# Patient Record
Sex: Female | Born: 1970 | Race: White | Hispanic: No | Marital: Married | Smoking: Never smoker
Health system: Southern US, Community
[De-identification: ages and names within clinical notes are randomized; demographics above are authoritative.]

## PROBLEM LIST (undated history)

## (undated) DIAGNOSIS — G4730 Sleep apnea, unspecified: Secondary | ICD-10-CM

## (undated) DIAGNOSIS — I639 Cerebral infarction, unspecified: Secondary | ICD-10-CM

## (undated) DIAGNOSIS — F329 Major depressive disorder, single episode, unspecified: Secondary | ICD-10-CM

## (undated) DIAGNOSIS — F32A Depression, unspecified: Secondary | ICD-10-CM

## (undated) DIAGNOSIS — E785 Hyperlipidemia, unspecified: Secondary | ICD-10-CM

## (undated) DIAGNOSIS — Z8489 Family history of other specified conditions: Secondary | ICD-10-CM

## (undated) DIAGNOSIS — K219 Gastro-esophageal reflux disease without esophagitis: Secondary | ICD-10-CM

## (undated) DIAGNOSIS — R7303 Prediabetes: Secondary | ICD-10-CM

## (undated) HISTORY — PX: JOINT REPLACEMENT: SHX530

## (undated) HISTORY — DX: Cerebral infarction, unspecified: I63.9

---

## 2015-05-07 HISTORY — PX: KNEE ARTHROSCOPY: SUR90

## 2015-10-07 DIAGNOSIS — I639 Cerebral infarction, unspecified: Secondary | ICD-10-CM

## 2015-10-07 HISTORY — DX: Cerebral infarction, unspecified: I63.9

## 2016-02-04 HISTORY — PX: PARTIAL KNEE ARTHROPLASTY: SHX2174

## 2017-01-19 DIAGNOSIS — N289 Disorder of kidney and ureter, unspecified: Secondary | ICD-10-CM | POA: Insufficient documentation

## 2017-02-18 ENCOUNTER — Other Ambulatory Visit: Payer: Self-pay | Admitting: Orthopedic Surgery

## 2017-02-18 DIAGNOSIS — Z96651 Presence of right artificial knee joint: Secondary | ICD-10-CM

## 2017-02-27 ENCOUNTER — Encounter
Admission: RE | Admit: 2017-02-27 | Discharge: 2017-02-27 | Disposition: A | Discharge status: Home / Self Care | Payer: BLUE CROSS/BLUE SHIELD | Source: Ambulatory Visit | Attending: Orthopedic Surgery | Admitting: Orthopedic Surgery

## 2017-02-27 ENCOUNTER — Other Ambulatory Visit: Payer: Self-pay | Admitting: Orthopedic Surgery

## 2017-02-27 DIAGNOSIS — Z96651 Presence of right artificial knee joint: Secondary | ICD-10-CM | POA: Diagnosis present

## 2017-02-27 MED ORDER — TECHNETIUM TC 99M MEDRONATE IV KIT
21.5400 | PACK | Freq: Once | INTRAVENOUS | Status: AC | PRN
  Administered 2017-02-27: 21.54 via INTRAVENOUS

## 2017-06-10 ENCOUNTER — Encounter
Admission: RE | Admit: 2017-06-10 | Discharge: 2017-06-10 | Disposition: A | Discharge status: Home / Self Care | Payer: BLUE CROSS/BLUE SHIELD | Source: Ambulatory Visit | Attending: Orthopedic Surgery | Admitting: Orthopedic Surgery

## 2017-06-10 ENCOUNTER — Encounter: Payer: Self-pay | Admitting: *Deleted

## 2017-06-10 DIAGNOSIS — Z888 Allergy status to other drugs, medicaments and biological substances status: Secondary | ICD-10-CM | POA: Diagnosis not present

## 2017-06-10 DIAGNOSIS — Z7982 Long term (current) use of aspirin: Secondary | ICD-10-CM | POA: Diagnosis not present

## 2017-06-10 DIAGNOSIS — Z9889 Other specified postprocedural states: Secondary | ICD-10-CM | POA: Diagnosis not present

## 2017-06-10 DIAGNOSIS — E785 Hyperlipidemia, unspecified: Secondary | ICD-10-CM | POA: Insufficient documentation

## 2017-06-10 DIAGNOSIS — F329 Major depressive disorder, single episode, unspecified: Secondary | ICD-10-CM | POA: Diagnosis not present

## 2017-06-10 DIAGNOSIS — Z882 Allergy status to sulfonamides status: Secondary | ICD-10-CM | POA: Diagnosis not present

## 2017-06-10 DIAGNOSIS — Z79899 Other long term (current) drug therapy: Secondary | ICD-10-CM | POA: Insufficient documentation

## 2017-06-10 DIAGNOSIS — Z01812 Encounter for preprocedural laboratory examination: Secondary | ICD-10-CM | POA: Insufficient documentation

## 2017-06-10 DIAGNOSIS — Z833 Family history of diabetes mellitus: Secondary | ICD-10-CM | POA: Insufficient documentation

## 2017-06-10 DIAGNOSIS — T8484XA Pain due to internal orthopedic prosthetic devices, implants and grafts, initial encounter: Secondary | ICD-10-CM | POA: Diagnosis not present

## 2017-06-10 DIAGNOSIS — Z818 Family history of other mental and behavioral disorders: Secondary | ICD-10-CM | POA: Insufficient documentation

## 2017-06-10 DIAGNOSIS — Z8249 Family history of ischemic heart disease and other diseases of the circulatory system: Secondary | ICD-10-CM | POA: Insufficient documentation

## 2017-06-10 DIAGNOSIS — Z79891 Long term (current) use of opiate analgesic: Secondary | ICD-10-CM | POA: Diagnosis not present

## 2017-06-10 HISTORY — DX: Major depressive disorder, single episode, unspecified: F32.9

## 2017-06-10 HISTORY — DX: Depression, unspecified: F32.A

## 2017-06-10 HISTORY — DX: Hyperlipidemia, unspecified: E78.5

## 2017-06-10 HISTORY — DX: Cerebral infarction, unspecified: I63.9

## 2017-06-10 LAB — C-REACTIVE PROTEIN: CRP: 1.3 mg/dL — AB (ref <1.0)

## 2017-06-10 LAB — SEDIMENTATION RATE: Sed Rate: 27 mm/hr — ABNORMAL HIGH (ref 0–20)

## 2017-06-10 LAB — COMPREHENSIVE METABOLIC PANEL
ALBUMIN: 3.7 g/dL (ref 3.5–5.0)
ALT: 28 U/L (ref 14–54)
AST: 23 U/L (ref 15–41)
Alkaline Phosphatase: 64 U/L (ref 38–126)
Anion gap: 7 (ref 5–15)
BILIRUBIN TOTAL: 0.5 mg/dL (ref 0.3–1.2)
BUN: 11 mg/dL (ref 6–20)
CHLORIDE: 105 mmol/L (ref 101–111)
CO2: 25 mmol/L (ref 22–32)
Calcium: 9 mg/dL (ref 8.9–10.3)
Creatinine, Ser: 1.17 mg/dL — ABNORMAL HIGH (ref 0.44–1.00)
GFR calc Af Amer: >60 mL/min (ref >60)
GFR calc non Af Amer: 55 mL/min — ABNORMAL LOW (ref >60)
GLUCOSE: 121 mg/dL — AB (ref 65–99)
POTASSIUM: 3.9 mmol/L (ref 3.5–5.1)
Sodium: 137 mmol/L (ref 135–145)
Total Protein: 7.1 g/dL (ref 6.5–8.1)

## 2017-06-10 LAB — CBC
HEMATOCRIT: 39.6 % (ref 35.0–47.0)
Hemoglobin: 13.5 g/dL (ref 12.0–16.0)
MCH: 30.8 pg (ref 26.0–34.0)
MCHC: 34.1 g/dL (ref 32.0–36.0)
MCV: 90.4 fL (ref 80.0–100.0)
PLATELETS: 297 10*3/uL (ref 150–440)
RBC: 4.38 MIL/uL (ref 3.80–5.20)
RDW: 13.9 % (ref 11.5–14.5)
WBC: 8.8 10*3/uL (ref 3.6–11.0)

## 2017-06-10 LAB — URINALYSIS, ROUTINE W REFLEX MICROSCOPIC
Bilirubin Urine: NEGATIVE
Glucose, UA: NEGATIVE mg/dL
Hgb urine dipstick: NEGATIVE
KETONES UR: NEGATIVE mg/dL
Leukocytes, UA: NEGATIVE
NITRITE: NEGATIVE
PROTEIN: NEGATIVE mg/dL
Specific Gravity, Urine: 1.003 — ABNORMAL LOW (ref 1.005–1.030)
pH: 6 (ref 5.0–8.0)

## 2017-06-10 LAB — PROTIME-INR
INR: 0.98
Prothrombin Time: 12.9 seconds (ref 11.4–15.2)

## 2017-06-10 LAB — TYPE AND SCREEN
ABO/RH(D): A POS
Antibody Screen: NEGATIVE

## 2017-06-10 LAB — SURGICAL PCR SCREEN
MRSA, PCR: NEGATIVE
Staphylococcus aureus: POSITIVE — AB

## 2017-06-10 LAB — APTT: APTT: 32 s (ref 24–36)

## 2017-06-10 NOTE — Patient Instructions (Signed)
  Your procedure is scheduled on: Mon. 06/22/17 Report to Day Surgery. To find out your arrival time please call [phone] between 1PM - 3PM on Friday.06/19/17  Remember: Instructions that are not followed completely may result in serious medical risk, up to and including death, or upon the discretion of your surgeon and anesthesiologist your surgery may need to be rescheduled.    __x__ 1. Do not eat food after midnight the night before your procedure. No gum chewing or hard candies. You may drink clear liquids up to 2 hours before you are scheduled to arrive for your surgery- DO not drink clear liquids within 2 hours of the start of your surgery.  Clear Liquids include: water, apple juice without pulp, clear carbohydrate drink such as Clearfast of Gartorade, Black Coffee or Tea (Do not add anything to coffee or tea).    __x__ 2. No Alcohol for 24 hours before or after surgery.   ____ 3. Do Not Smoke For 24 Hours Prior to Your Surgery.   ____ 4. Bring all medications with you on the day of surgery if instructed.    __x__ 5. Notify your doctor if there is any change in your medical condition     (cold, fever, infections).       Do not wear jewelry, make-up, hairpins, clips or nail polish.  Do not wear lotions, powders, or perfumes. You may wear deodorant.  Do not shave 48 hours prior to surgery. Men may shave face and neck.  Do not bring valuables to the hospital.    [facility] or [facility]uables.               Contacts, dentures or bridgework may not be worn into surgery.  Leave your suitcase in the car. After surgery it may be brought to your room.  For patients admitted to the hospital, discharge time is determined by your                treatment team.   Patients discharged the day of surgery will not be allowed to drive home.   Please read over the following fact sheets that you were given:   MRSA Information   _x___ Take  these medicines the morning of surgery with A SIP OF WATER:    1. buPROPion (WELLBUTRIN XL) 150 MG 24 hr tablet  2. loratadine (CLARITIN) 10 MG tablet  3. rosuvastatin (CRESTOR) 5 MG tablet  4.  5.  6.  ____ Fleet Enema (as directed)   __x__ Use CHG Soap as directed  ____ Use inhalers on the day of surgery  ____ Stop metformin 2 days prior to surgery    ____ Take 1/2 of usual insulin dose the night before surgery and none on the morning of surgery.   __x__ Stop aspirin on 9/12 per neurologist instructions  __x__ Stop Anti-inflammatories on Naproxen Sod-Diphenhydramine (ALEVE PM) 220-25 MG TABS 06/15/18 Tylenol is OK   ____ Stop supplements until after surgery.    ____ Bring C-Pap to the hospital.

## 2017-06-11 LAB — URINE CULTURE
CULTURE: NO GROWTH
SPECIAL REQUESTS: NORMAL

## 2017-06-12 NOTE — Pre-Procedure Instructions (Signed)
C Reactive Protein results sent to Dr. Ernest PineHooten for review.

## 2017-06-21 MED ORDER — CEFAZOLIN SODIUM-DEXTROSE 2-4 GM/100ML-% IV SOLN
2.0000 g | INTRAVENOUS | Status: AC
  Administered 2017-06-22 (×2): 2 g via INTRAVENOUS

## 2017-06-21 MED ORDER — SODIUM CHLORIDE 0.9 % IV SOLN
1000.0000 mg | INTRAVENOUS | Status: DC
  Filled 2017-06-21: qty 10

## 2017-06-21 NOTE — Discharge Instructions (Signed)
°  Instructions after Total Knee Replacement ° ° Diamon Reddinger P. Rorey Hodges, Jr., M.D.    ° Dept. of Orthopaedics & Sports Medicine ° [facility] ° [address] ° [place], [place]  [address] ° Phone: [phone]   Fax: [phone] ° °  °DIET: °• Drink plenty of non-alcoholic fluids. °• Resume your normal diet. Include foods high in fiber. ° °ACTIVITY:  °• You may use crutches or a walker with weight-bearing as tolerated, unless instructed otherwise. °• You may be weaned off of the walker or crutches by your Physical Therapist.  °• Do NOT place pillows under the knee. Anything placed under the knee could limit your ability to straighten the knee.   °• Continue doing gentle exercises. Exercising will reduce the pain and swelling, increase motion, and prevent muscle weakness.   °• Please continue to use the TED compression stockings for 6 weeks. You may remove the stockings at night, but should reapply them in the morning. °• Do not drive or operate any equipment until instructed. ° °WOUND CARE:  °• Continue to use the PolarCare or ice packs periodically to reduce pain and swelling. °• You may bathe or shower after the staples are removed at the first office visit following surgery. ° °MEDICATIONS: °• You may resume your regular medications. °• Please take the pain medication as prescribed on the medication. °• Do not take pain medication on an empty stomach. °• You have been given a prescription for a blood thinner (Lovenox or Coumadin). Please take the medication as instructed. (NOTE: After completing a 2 week course of Lovenox, take one Enteric-coated aspirin once a day. This along with elevation will help reduce the possibility of phlebitis in your operated leg.) °• Do not drive or drink alcoholic beverages when taking pain medications. ° °CALL THE OFFICE FOR: °• Temperature above 101 degrees °• Excessive bleeding or drainage on the dressing. °• Excessive swelling, coldness, or paleness of the toes. °• Persistent  nausea and vomiting. ° °FOLLOW-UP:  °• You should have an appointment to return to the office in 10-14 days after surgery. °• Arrangements have been made for continuation of Physical Therapy (either home therapy or outpatient therapy). °  °

## 2017-06-22 ENCOUNTER — Encounter: Payer: Self-pay | Admitting: *Deleted

## 2017-06-22 ENCOUNTER — Inpatient Hospital Stay
Admission: RE | Admit: 2017-06-22 | Discharge: 2017-06-24 | DRG: 468 | Disposition: A | Discharge status: Home Health | Payer: BLUE CROSS/BLUE SHIELD | Source: Ambulatory Visit | Attending: Orthopedic Surgery | Admitting: Orthopedic Surgery

## 2017-06-22 ENCOUNTER — Inpatient Hospital Stay: Payer: BLUE CROSS/BLUE SHIELD | Admitting: Anesthesiology

## 2017-06-22 ENCOUNTER — Encounter
Admission: RE | Disposition: A | Discharge status: Home Health | Payer: Self-pay | Source: Ambulatory Visit | Attending: Orthopedic Surgery

## 2017-06-22 ENCOUNTER — Inpatient Hospital Stay: Payer: BLUE CROSS/BLUE SHIELD

## 2017-06-22 DIAGNOSIS — Z8673 Personal history of transient ischemic attack (TIA), and cerebral infarction without residual deficits: Secondary | ICD-10-CM | POA: Diagnosis not present

## 2017-06-22 DIAGNOSIS — T84022A Instability of internal right knee prosthesis, initial encounter: Principal | ICD-10-CM | POA: Diagnosis present

## 2017-06-22 DIAGNOSIS — Z79891 Long term (current) use of opiate analgesic: Secondary | ICD-10-CM

## 2017-06-22 DIAGNOSIS — Z79899 Other long term (current) drug therapy: Secondary | ICD-10-CM | POA: Diagnosis not present

## 2017-06-22 DIAGNOSIS — Z882 Allergy status to sulfonamides status: Secondary | ICD-10-CM | POA: Diagnosis not present

## 2017-06-22 DIAGNOSIS — Z792 Long term (current) use of antibiotics: Secondary | ICD-10-CM

## 2017-06-22 DIAGNOSIS — Y834 Other reconstructive surgery as the cause of abnormal reaction of the patient, or of later complication, without mention of misadventure at the time of the procedure: Secondary | ICD-10-CM | POA: Diagnosis present

## 2017-06-22 DIAGNOSIS — E785 Hyperlipidemia, unspecified: Secondary | ICD-10-CM | POA: Diagnosis present

## 2017-06-22 DIAGNOSIS — Z888 Allergy status to other drugs, medicaments and biological substances status: Secondary | ICD-10-CM | POA: Diagnosis not present

## 2017-06-22 DIAGNOSIS — Z7982 Long term (current) use of aspirin: Secondary | ICD-10-CM

## 2017-06-22 DIAGNOSIS — Z791 Long term (current) use of non-steroidal anti-inflammatories (NSAID): Secondary | ICD-10-CM | POA: Diagnosis not present

## 2017-06-22 DIAGNOSIS — Y792 Prosthetic and other implants, materials and accessory orthopedic devices associated with adverse incidents: Secondary | ICD-10-CM | POA: Diagnosis present

## 2017-06-22 DIAGNOSIS — T8484XA Pain due to internal orthopedic prosthetic devices, implants and grafts, initial encounter: Secondary | ICD-10-CM | POA: Diagnosis present

## 2017-06-22 DIAGNOSIS — F329 Major depressive disorder, single episode, unspecified: Secondary | ICD-10-CM | POA: Diagnosis present

## 2017-06-22 DIAGNOSIS — Z96659 Presence of unspecified artificial knee joint: Secondary | ICD-10-CM

## 2017-06-22 DIAGNOSIS — Z96651 Presence of right artificial knee joint: Secondary | ICD-10-CM

## 2017-06-22 HISTORY — PX: TOTAL KNEE REVISION: SHX996

## 2017-06-22 LAB — ABO/RH: ABO/RH(D): A POS

## 2017-06-22 LAB — SYNOVIAL CELL COUNT + DIFF, W/ CRYSTALS
Crystals, Fluid: NONE SEEN
LYMPHOCYTES-SYNOVIAL FLD: 82 %
Monocyte-Macrophage-Synovial Fluid: 11 %
NEUTROPHIL, SYNOVIAL: 7 %
WBC, SYNOVIAL: 114 /mm3 (ref 0–200)

## 2017-06-22 LAB — POCT PREGNANCY, URINE: Preg Test, Ur: NEGATIVE

## 2017-06-22 SURGERY — TOTAL KNEE REVISION
Anesthesia: Spinal | Laterality: Right

## 2017-06-22 MED ORDER — NEOMYCIN-POLYMYXIN B GU 40-200000 IR SOLN
Status: AC
  Filled 2017-06-22: qty 14

## 2017-06-22 MED ORDER — FENTANYL CITRATE (PF) 100 MCG/2ML IJ SOLN
25.0000 ug | INTRAMUSCULAR | Status: DC | PRN
  Administered 2017-06-22: 25 ug via INTRAVENOUS

## 2017-06-22 MED ORDER — ONDANSETRON HCL 4 MG PO TABS: 4.0000 mg | ORAL_TABLET | Freq: Four times a day (QID) | ORAL | Status: DC | PRN

## 2017-06-22 MED ORDER — ACETAMINOPHEN 650 MG RE SUPP: 650.0000 mg | Freq: Four times a day (QID) | RECTAL | Status: DC | PRN

## 2017-06-22 MED ORDER — PANTOPRAZOLE SODIUM 40 MG PO TBEC
40.0000 mg | DELAYED_RELEASE_TABLET | Freq: Two times a day (BID) | ORAL | Status: DC
  Administered 2017-06-22 – 2017-06-24 (×4): 40 mg via ORAL
  Filled 2017-06-22 (×4): qty 1

## 2017-06-22 MED ORDER — PROPOFOL 10 MG/ML IV BOLUS
INTRAVENOUS | Status: AC
  Filled 2017-06-22: qty 20

## 2017-06-22 MED ORDER — PROPOFOL 500 MG/50ML IV EMUL
INTRAVENOUS | Status: DC | PRN
  Administered 2017-06-22: 100 ug/kg/min via INTRAVENOUS

## 2017-06-22 MED ORDER — FENTANYL CITRATE (PF) 100 MCG/2ML IJ SOLN
INTRAMUSCULAR | Status: AC
  Administered 2017-06-22: 25 ug via INTRAVENOUS
  Filled 2017-06-22: qty 2

## 2017-06-22 MED ORDER — TOBRAMYCIN SULFATE 1.2 G IJ SOLR
INTRAMUSCULAR | Status: AC
  Filled 2017-06-22: qty 10.8

## 2017-06-22 MED ORDER — FAMOTIDINE 20 MG PO TABS
ORAL_TABLET | ORAL | Status: AC
  Administered 2017-06-22: 20 mg via ORAL
  Filled 2017-06-22: qty 1

## 2017-06-22 MED ORDER — TRANEXAMIC ACID 1000 MG/10ML IV SOLN
1000.0000 mg | Freq: Once | INTRAVENOUS | Status: AC
  Administered 2017-06-22: 1000 mg via INTRAVENOUS
  Filled 2017-06-22: qty 10

## 2017-06-22 MED ORDER — ONDANSETRON HCL 4 MG/2ML IJ SOLN
INTRAMUSCULAR | Status: DC | PRN
  Administered 2017-06-22: 4 mg via INTRAVENOUS

## 2017-06-22 MED ORDER — MORPHINE SULFATE (PF) 2 MG/ML IV SOLN
2.0000 mg | INTRAVENOUS | Status: DC | PRN
  Administered 2017-06-22: 2 mg via INTRAVENOUS
  Filled 2017-06-22: qty 1

## 2017-06-22 MED ORDER — ENOXAPARIN SODIUM 30 MG/0.3ML ~~LOC~~ SOLN
30.0000 mg | Freq: Two times a day (BID) | SUBCUTANEOUS | Status: DC
  Administered 2017-06-23 – 2017-06-24 (×3): 30 mg via SUBCUTANEOUS
  Filled 2017-06-22 (×3): qty 0.3

## 2017-06-22 MED ORDER — NEOMYCIN-POLYMYXIN B GU 40-200000 IR SOLN
Status: DC | PRN
Start: 2017-06-22 — End: 2017-06-22
  Administered 2017-06-22: 14 mL

## 2017-06-22 MED ORDER — MAGNESIUM HYDROXIDE 400 MG/5ML PO SUSP
30.0000 mL | Freq: Every day | ORAL | Status: DC | PRN
  Administered 2017-06-22: 30 mL via ORAL
  Filled 2017-06-22: qty 30

## 2017-06-22 MED ORDER — BUPIVACAINE HCL (PF) 0.5 % IJ SOLN
INTRAMUSCULAR | Status: DC | PRN
Start: 2017-06-22 — End: 2017-06-22
  Administered 2017-06-22: 2 mL

## 2017-06-22 MED ORDER — LACTATED RINGERS IV SOLN
INTRAVENOUS | Status: DC
  Administered 2017-06-22 (×3): via INTRAVENOUS

## 2017-06-22 MED ORDER — ACETAMINOPHEN 10 MG/ML IV SOLN
INTRAVENOUS | Status: DC | PRN
  Administered 2017-06-22: 1000 mg via INTRAVENOUS

## 2017-06-22 MED ORDER — MIDAZOLAM HCL 2 MG/2ML IJ SOLN
INTRAMUSCULAR | Status: AC
  Filled 2017-06-22: qty 2

## 2017-06-22 MED ORDER — MIDAZOLAM HCL 5 MG/5ML IJ SOLN
INTRAMUSCULAR | Status: DC | PRN
  Administered 2017-06-22 (×2): 2 mg via INTRAVENOUS

## 2017-06-22 MED ORDER — MENTHOL 3 MG MT LOZG
1.0000 | LOZENGE | OROMUCOSAL | Status: DC | PRN
  Filled 2017-06-22: qty 9

## 2017-06-22 MED ORDER — ACETAMINOPHEN 10 MG/ML IV SOLN
1000.0000 mg | Freq: Four times a day (QID) | INTRAVENOUS | Status: AC
  Administered 2017-06-22 (×3): 1000 mg via INTRAVENOUS
  Filled 2017-06-22 (×4): qty 100

## 2017-06-22 MED ORDER — TETRACAINE HCL 1 % IJ SOLN
INTRAMUSCULAR | Status: DC | PRN
  Administered 2017-06-22: 10 mg via INTRASPINAL

## 2017-06-22 MED ORDER — FAMOTIDINE 20 MG PO TABS
20.0000 mg | ORAL_TABLET | Freq: Once | ORAL | Status: AC
  Administered 2017-06-22: 20 mg via ORAL

## 2017-06-22 MED ORDER — FERROUS SULFATE 325 (65 FE) MG PO TABS
325.0000 mg | ORAL_TABLET | Freq: Two times a day (BID) | ORAL | Status: DC
  Administered 2017-06-22 – 2017-06-24 (×3): 325 mg via ORAL
  Filled 2017-06-22 (×3): qty 1

## 2017-06-22 MED ORDER — PROPOFOL 500 MG/50ML IV EMUL
INTRAVENOUS | Status: AC
  Filled 2017-06-22: qty 50

## 2017-06-22 MED ORDER — PHENYLEPHRINE HCL 10 MG/ML IJ SOLN
INTRAMUSCULAR | Status: AC
  Filled 2017-06-22: qty 1

## 2017-06-22 MED ORDER — BUPIVACAINE HCL (PF) 0.25 % IJ SOLN
INTRAMUSCULAR | Status: AC
  Filled 2017-06-22: qty 60

## 2017-06-22 MED ORDER — BISACODYL 10 MG RE SUPP
10.0000 mg | Freq: Every day | RECTAL | Status: DC | PRN
  Administered 2017-06-24: 10 mg via RECTAL
  Filled 2017-06-22: qty 1

## 2017-06-22 MED ORDER — CHLORHEXIDINE GLUCONATE 4 % EX LIQD: 60.0000 mL | Freq: Once | CUTANEOUS | Status: DC

## 2017-06-22 MED ORDER — ACETAMINOPHEN 325 MG PO TABS: 650.0000 mg | ORAL_TABLET | Freq: Four times a day (QID) | ORAL | Status: DC | PRN

## 2017-06-22 MED ORDER — TRAMADOL HCL 50 MG PO TABS
50.0000 mg | ORAL_TABLET | ORAL | Status: DC | PRN
  Administered 2017-06-22 – 2017-06-24 (×9): 100 mg via ORAL
  Filled 2017-06-22 (×9): qty 2

## 2017-06-22 MED ORDER — SODIUM CHLORIDE 0.9 % IV SOLN
INTRAVENOUS | Status: DC
  Administered 2017-06-22 (×2): via INTRAVENOUS

## 2017-06-22 MED ORDER — TRANEXAMIC ACID 1000 MG/10ML IV SOLN
INTRAVENOUS | Status: DC | PRN
  Administered 2017-06-22: 1000 mg via INTRAVENOUS

## 2017-06-22 MED ORDER — CEFAZOLIN SODIUM-DEXTROSE 2-4 GM/100ML-% IV SOLN
INTRAVENOUS | Status: AC
  Filled 2017-06-22: qty 100

## 2017-06-22 MED ORDER — CEFAZOLIN SODIUM-DEXTROSE 2-4 GM/100ML-% IV SOLN
2.0000 g | Freq: Four times a day (QID) | INTRAVENOUS | Status: AC
  Administered 2017-06-22 – 2017-06-23 (×4): 2 g via INTRAVENOUS
  Filled 2017-06-22 (×4): qty 100

## 2017-06-22 MED ORDER — LORATADINE 10 MG PO TABS
10.0000 mg | ORAL_TABLET | Freq: Every day | ORAL | Status: DC
  Administered 2017-06-23 – 2017-06-24 (×2): 10 mg via ORAL
  Filled 2017-06-22 (×2): qty 1

## 2017-06-22 MED ORDER — FENTANYL CITRATE (PF) 100 MCG/2ML IJ SOLN
INTRAMUSCULAR | Status: AC
  Filled 2017-06-22: qty 2

## 2017-06-22 MED ORDER — SODIUM CHLORIDE 0.9 % IJ SOLN
INTRAMUSCULAR | Status: AC
  Filled 2017-06-22: qty 50

## 2017-06-22 MED ORDER — BUPIVACAINE-EPINEPHRINE (PF) 0.25% -1:200000 IJ SOLN
INTRAMUSCULAR | Status: DC | PRN
  Administered 2017-06-22: 60 mL via PERINEURAL

## 2017-06-22 MED ORDER — OXYCODONE HCL 5 MG PO TABS
5.0000 mg | ORAL_TABLET | ORAL | Status: DC | PRN
  Administered 2017-06-22 – 2017-06-24 (×10): 10 mg via ORAL
  Filled 2017-06-22 (×11): qty 2

## 2017-06-22 MED ORDER — ACETAMINOPHEN 10 MG/ML IV SOLN
INTRAVENOUS | Status: AC
  Filled 2017-06-22: qty 100

## 2017-06-22 MED ORDER — METOCLOPRAMIDE HCL 10 MG PO TABS
10.0000 mg | ORAL_TABLET | Freq: Three times a day (TID) | ORAL | Status: DC
  Administered 2017-06-22 – 2017-06-23 (×5): 10 mg via ORAL
  Filled 2017-06-22 (×5): qty 1

## 2017-06-22 MED ORDER — BUPIVACAINE LIPOSOME 1.3 % IJ SUSP
INTRAMUSCULAR | Status: AC
  Filled 2017-06-22: qty 20

## 2017-06-22 MED ORDER — SENNOSIDES-DOCUSATE SODIUM 8.6-50 MG PO TABS
1.0000 | ORAL_TABLET | Freq: Two times a day (BID) | ORAL | Status: DC
  Administered 2017-06-22 – 2017-06-23 (×3): 1 via ORAL
  Filled 2017-06-22 (×3): qty 1

## 2017-06-22 MED ORDER — PROMETHAZINE HCL 25 MG/ML IJ SOLN: 6.2500 mg | INTRAMUSCULAR | Status: DC | PRN

## 2017-06-22 MED ORDER — FLEET ENEMA 7-19 GM/118ML RE ENEM: 1.0000 | ENEMA | Freq: Once | RECTAL | Status: DC | PRN

## 2017-06-22 MED ORDER — BUPROPION HCL ER (XL) 150 MG PO TB24
150.0000 mg | ORAL_TABLET | Freq: Every day | ORAL | Status: DC
  Administered 2017-06-23 – 2017-06-24 (×2): 150 mg via ORAL
  Filled 2017-06-22 (×2): qty 1

## 2017-06-22 MED ORDER — SODIUM CHLORIDE 0.9 % IV SOLN
INTRAVENOUS | Status: DC | PRN
  Administered 2017-06-22: 60 mL

## 2017-06-22 MED ORDER — ONDANSETRON HCL 4 MG/2ML IJ SOLN: 4.0000 mg | Freq: Four times a day (QID) | INTRAMUSCULAR | Status: DC | PRN

## 2017-06-22 MED ORDER — DIPHENHYDRAMINE HCL 12.5 MG/5ML PO ELIX: 12.5000 mg | ORAL_SOLUTION | ORAL | Status: DC | PRN

## 2017-06-22 MED ORDER — ONDANSETRON HCL 4 MG/2ML IJ SOLN
INTRAMUSCULAR | Status: AC
  Filled 2017-06-22: qty 2

## 2017-06-22 MED ORDER — ROSUVASTATIN CALCIUM 10 MG PO TABS
5.0000 mg | ORAL_TABLET | Freq: Every day | ORAL | Status: DC
  Administered 2017-06-23 – 2017-06-24 (×2): 5 mg via ORAL
  Filled 2017-06-22: qty 1

## 2017-06-22 MED ORDER — FENTANYL CITRATE (PF) 100 MCG/2ML IJ SOLN
INTRAMUSCULAR | Status: DC | PRN
  Administered 2017-06-22 (×2): 50 ug via INTRAVENOUS

## 2017-06-22 MED ORDER — ALUM & MAG HYDROXIDE-SIMETH 200-200-20 MG/5ML PO SUSP: 30.0000 mL | ORAL | Status: DC | PRN

## 2017-06-22 MED ORDER — BUPIVACAINE-EPINEPHRINE (PF) 0.25% -1:200000 IJ SOLN
INTRAMUSCULAR | Status: AC
  Filled 2017-06-22: qty 60

## 2017-06-22 MED ORDER — SODIUM CHLORIDE 0.9 % IV SOLN
INTRAVENOUS | Status: DC | PRN
  Administered 2017-06-22: 30 ug/min via INTRAVENOUS

## 2017-06-22 MED ORDER — VANCOMYCIN HCL 1000 MG IV SOLR
INTRAVENOUS | Status: AC
  Filled 2017-06-22: qty 3000

## 2017-06-22 MED ORDER — PHENOL 1.4 % MT LIQD
1.0000 | OROMUCOSAL | Status: DC | PRN
  Filled 2017-06-22: qty 177

## 2017-06-22 MED ORDER — BUPIVACAINE HCL (PF) 0.5 % IJ SOLN
INTRAMUSCULAR | Status: AC
  Filled 2017-06-22: qty 10

## 2017-06-22 SURGICAL SUPPLY — 83 items
BLADE OSCILLATING/SAGITTAL (BLADE) ×2
BLADE SAW 1 (BLADE) ×3 IMPLANT
BLADE SAW 1/2 (BLADE) ×3 IMPLANT
BLADE SW THK.38XMED LNG THN (BLADE) ×1 IMPLANT
BONE CEMENT GENTAMICIN (Cement) ×6 IMPLANT
CANISTER SUCT 1200ML W/VALVE (MISCELLANEOUS) ×3 IMPLANT
CANISTER SUCT 3000ML PPV (MISCELLANEOUS) ×6 IMPLANT
CAP KNEE TOTAL 3 SIGMA ×3 IMPLANT
CATH TRAY METER 16FR LF (MISCELLANEOUS) ×3 IMPLANT
CEMENT BONE GENTAMICIN 40 (Cement) ×2 IMPLANT
CNTNR SPEC 2.5X3XGRAD LEK (MISCELLANEOUS) ×1
CONT SPEC 4OZ STER OR WHT (MISCELLANEOUS) ×2
CONTAINER SPEC 2.5X3XGRAD LEK (MISCELLANEOUS) ×1 IMPLANT
COOLER POLAR GLACIER W/PUMP (MISCELLANEOUS) ×3 IMPLANT
CUFF TOURN 24 STER (MISCELLANEOUS) IMPLANT
CUFF TOURN 30 STER DUAL PORT (MISCELLANEOUS) IMPLANT
DECANTER SPIKE VIAL GLASS SM (MISCELLANEOUS) ×6 IMPLANT
DRAPE SHEET LG 3/4 BI-LAMINATE (DRAPES) ×6 IMPLANT
DRAPE TABLE BACK 80X90 (DRAPES) ×3 IMPLANT
DRSG DERMACEA 8X12 NADH (GAUZE/BANDAGES/DRESSINGS) ×3 IMPLANT
DRSG OPSITE POSTOP 4X14 (GAUZE/BANDAGES/DRESSINGS) ×3 IMPLANT
DURAPREP 26ML APPLICATOR (WOUND CARE) ×3 IMPLANT
ELECT CAUTERY BLADE 6.4 (BLADE) ×6 IMPLANT
ELECT REM PT RETURN 9FT ADLT (ELECTROSURGICAL) ×3
ELECTRODE REM PT RTRN 9FT ADLT (ELECTROSURGICAL) ×1 IMPLANT
EVACUATOR 1/8 PVC DRAIN (DRAIN) ×3 IMPLANT
FLEXIBLE OSTEOTOME ×6 IMPLANT
GAUZE SPONGE 4X4 12PLY STRL (GAUZE/BANDAGES/DRESSINGS) ×3 IMPLANT
GLOVE BIO SURGEON STRL SZ7 (GLOVE) ×6 IMPLANT
GLOVE BIOGEL M STRL SZ7.5 (GLOVE) ×3 IMPLANT
GLOVE BIOGEL PI IND STRL 7.5 (GLOVE) ×2 IMPLANT
GLOVE BIOGEL PI IND STRL 9 (GLOVE) ×1 IMPLANT
GLOVE BIOGEL PI INDICATOR 7.5 (GLOVE) ×4
GLOVE BIOGEL PI INDICATOR 9 (GLOVE) ×2
GLOVE INDICATOR 8.0 STRL GRN (GLOVE) ×3 IMPLANT
GLOVE SURG ORTHO 9.0 STRL STRW (GLOVE) ×3 IMPLANT
GLOVE SURG SYN 9.0  PF PI (GLOVE) ×2
GLOVE SURG SYN 9.0 PF PI (GLOVE) ×1 IMPLANT
GOWN STRL REUS W/ TWL LRG LVL3 (GOWN DISPOSABLE) ×3 IMPLANT
GOWN STRL REUS W/TWL 2XL LVL3 (GOWN DISPOSABLE) ×3 IMPLANT
GOWN STRL REUS W/TWL LRG LVL3 (GOWN DISPOSABLE) ×6
HANDPIECE VERSAJET DEBRIDEMENT (MISCELLANEOUS) IMPLANT
HOOD PEEL AWAY FLYTE STAYCOOL (MISCELLANEOUS) ×6 IMPLANT
IRRIGATION STRYKERFLOW (MISCELLANEOUS) ×1 IMPLANT
IRRIGATOR STRYKERFLOW (MISCELLANEOUS) ×3
KIT RM TURNOVER STRD PROC AR (KITS) ×3 IMPLANT
KNIFE SCULPS 14X20 (INSTRUMENTS) ×3 IMPLANT
NDL SAFETY 18GX1.5 (NEEDLE) ×3 IMPLANT
NEEDLE FILTER BLUNT 18X 1/2SAF (NEEDLE) ×2
NEEDLE FILTER BLUNT 18X1 1/2 (NEEDLE) ×1 IMPLANT
NEEDLE SPNL 18GX3.5 QUINCKE PK (NEEDLE) ×3 IMPLANT
NEEDLE SPNL 20GX3.5 QUINCKE YW (NEEDLE) ×6 IMPLANT
NS IRRIG 1000ML POUR BTL (IV SOLUTION) IMPLANT
OSTEOTOME THIN 6.0 1.5 (INSTRUMENTS) ×3 IMPLANT
OSTEOTOME THIN 6.0 3 (INSTRUMENTS) ×3 IMPLANT
PACK TOTAL KNEE (MISCELLANEOUS) ×3 IMPLANT
PAD ABD DERMACEA PRESS 5X9 (GAUZE/BANDAGES/DRESSINGS) IMPLANT
PAD WRAPON POLAR KNEE (MISCELLANEOUS) ×1 IMPLANT
PENCIL ELECTRO HAND CTR (MISCELLANEOUS) ×3 IMPLANT
PULSAVAC PLUS IRRIG FAN TIP (DISPOSABLE) ×3
SOL .9 NS 3000ML IRR  AL (IV SOLUTION) ×2
SOL .9 NS 3000ML IRR UROMATIC (IV SOLUTION) ×1 IMPLANT
SPONGE LAP 18X18 5 PK (GAUZE/BANDAGES/DRESSINGS) ×3 IMPLANT
STAPLER SKIN PROX 35W (STAPLE) ×3 IMPLANT
SUCTION FRAZIER HANDLE 10FR (MISCELLANEOUS) ×2
SUCTION TUBE FRAZIER 10FR DISP (MISCELLANEOUS) ×1 IMPLANT
SUT BONE WAX W31G (SUTURE) ×3 IMPLANT
SUT MNCRL 0 1X36 CT-1 (SUTURE) ×1 IMPLANT
SUT MON AB 2-0 CT1 36 (SUTURE) IMPLANT
SUT MONOCRYL 0 (SUTURE) ×2
SUT PROLENE 1 CT 1 30 (SUTURE) IMPLANT
SUT VIC AB 0 CT1 36 (SUTURE) ×3 IMPLANT
SUT VIC AB 1 CT1 36 (SUTURE) ×6 IMPLANT
SUT VIC AB 2-0 CT1 (SUTURE) ×3 IMPLANT
SWAB CULTURE AMIES ANAERIB BLU (MISCELLANEOUS) ×6 IMPLANT
SYR 20CC LL (SYRINGE) ×3 IMPLANT
SYR 30ML LL (SYRINGE) ×6 IMPLANT
TIP COAXIAL FEMORAL CANAL (MISCELLANEOUS) ×3 IMPLANT
TIP FAN IRRIG PULSAVAC PLUS (DISPOSABLE) ×1 IMPLANT
TOWEL OR 17X26 4PK STRL BLUE (TOWEL DISPOSABLE) ×3 IMPLANT
TOWER CARTRIDGE SMART MIX (DISPOSABLE) ×3 IMPLANT
WATER STERILE IRR 1000ML POUR (IV SOLUTION) ×3 IMPLANT
WRAPON POLAR PAD KNEE (MISCELLANEOUS) ×3

## 2017-06-22 NOTE — Progress Notes (Signed)
PT Cancellation Note  Patient Details Name: Mckenzie Roberts MRN: [id number] DOB: 02-Sep-1971   Cancelled Treatment:    Reason Eval/Treat Not Completed: Other (comment).  Pt's sensation has not returned following R TKA.  Will attempt to see pt tomorrow 9/18.     Encarnacion Chu PT, DPT 06/22/2017, 4:33 PM

## 2017-06-22 NOTE — Transfer of Care (Signed)
Immediate Anesthesia Transfer of Care Note  Patient: Mckenzie Roberts  Procedure(s) Performed: Procedure(s): TOTAL KNEE REVISION (Right)  Patient Location: PACU  Anesthesia Type:Spinal  Level of Consciousness: sedated  Airway & Oxygen Therapy: Patient Spontanous Breathing and Patient connected to face mask oxygen  Post-op Assessment: Report given to RN and Post -op Vital signs reviewed and stable  Post vital signs: Reviewed and stable  Last Vitals:  Vitals:   06/22/17 0638 06/22/17 1231  BP: 137/65 119/86  Pulse: 89 80  Resp: 18 20  Temp: 36.9 C 36.5 C  SpO2: 100% 100%    Last Pain:  Vitals:   06/22/17 1231  TempSrc: Temporal  PainSc:          Complications: No apparent anesthesia complications

## 2017-06-22 NOTE — OR Nursing (Signed)
Right knee medial unicompartmental arthroplasty   3 explants removed

## 2017-06-22 NOTE — OR Nursing (Signed)
3 Explants removed from right knee are restoris size 3 femur, size 4 medial onlay tibia, 8 mm. Polyethylene insert.

## 2017-06-22 NOTE — Anesthesia Procedure Notes (Signed)
Spinal  Patient location during procedure: OR Start time: 06/22/2017 7:22 AM End time: 06/22/2017 7:29 AM Staffing Resident/CRNA: Nelda Marseille Performed: resident/CRNA  Preanesthetic Checklist Completed: patient identified, site marked, surgical consent, pre-op evaluation, timeout performed, IV checked, risks and benefits discussed and monitors and equipment checked Spinal Block Patient position: sitting Prep: Betadine Patient monitoring: heart rate, continuous pulse ox, blood pressure and cardiac monitor Approach: midline Location: L3-4 Injection technique: single-shot Needle Needle type: Whitacre and Introducer  Needle gauge: 25 G Needle length: 9 cm Assessment Sensory level: T10 Additional Notes Negative paresthesia. Negative blood return. Positive free-flowing CSF. Expiration date of kit checked and confirmed. Patient tolerated procedure well, without complications.

## 2017-06-22 NOTE — Anesthesia Preprocedure Evaluation (Signed)
Anesthesia Evaluation  Patient identified by MRN, date of birth, ID band Patient awake    Reviewed: Allergy & Precautions, H&P , NPO status , Patient's Chart, lab work & pertinent test results, reviewed documented beta blocker date and time   History of Anesthesia Complications Negative for: history of anesthetic complications  Airway Mallampati: IV  TM Distance: >3 FB Neck ROM: full    Dental  (+) Caps, Dental Advidsory Given, Teeth Intact   Pulmonary neg pulmonary ROS,           Cardiovascular Exercise Tolerance: Good negative cardio ROS       Neuro/Psych neg Seizures PSYCHIATRIC DISORDERS (Depression) TIA   GI/Hepatic negative GI ROS, Neg liver ROS,   Endo/Other  negative endocrine ROS  Renal/GU negative Renal ROS  negative genitourinary   Musculoskeletal   Abdominal   Peds  Hematology negative hematology ROS (+)   Anesthesia Other Findings Past Medical History: No date: Depression No date: Hyperlipemia No date: Stroke [facility])     Comment:  TIA   Reproductive/Obstetrics negative OB ROS                             Anesthesia Physical Anesthesia Plan  ASA: II  Anesthesia Plan: Spinal   Post-op Pain Management:    Induction:   PONV Risk Score and Plan: 3 and Ondansetron, Dexamethasone, Propofol infusion and Midazolam  Airway Management Planned: Natural Airway and Simple Face Mask  Additional Equipment:   Intra-op Plan:   Post-operative Plan:   Informed Consent: I have reviewed the patients History and Physical, chart, labs and discussed the procedure including the risks, benefits and alternatives for the proposed anesthesia with the patient or authorized representative who has indicated his/her understanding and acceptance.   Dental Advisory Given  Plan Discussed with: Anesthesiologist, CRNA and Surgeon  Anesthesia Plan Comments:         Anesthesia Quick  Evaluation

## 2017-06-22 NOTE — Op Note (Signed)
OPERATIVE NOTE  DATE OF SURGERY:  06/22/2017  PATIENT NAME:  Mckenzie Roberts   DOB: October 07, 1970  MRN: [id number]  PRE-OPERATIVE DIAGNOSIS: Painful, loose right medial unicompartmental knee arthroplasty  POST-OPERATIVE DIAGNOSIS:  Same   PROCEDURE:  Revision of a right medial unicompartmental knee arthroplasty to a right total knee arthroplasty (removal of unicompartmental knee implants)  SURGEON:  Jena Gauss. M.D.  ASSISTANT:  Van Clines, PA (present and scrubbed throughout the case, critical for assistance with exposure, retraction, instrumentation, and closure)  ANESTHESIA: spinal  ESTIMATED BLOOD LOSS: 50 mL  FLUIDS REPLACED: 2100 mL of crystalloid  TOURNIQUET TIME: 165 minutes  DRAINS: 2 medium Hemovac drains  SOFT TISSUE RELEASES: Anterior cruciate ligament, posterior cruciate ligament, deep medial collateral ligament, patellofemoral ligament  IMPLANTS UTILIZED: DePuy PFC Sigma size 2.5 posterior stabilized femoral component (cemented), size 2.5 MBT tibial component (cemented), 35 mm 3 peg oval dome patella (cemented), and a 10 mm stabilized rotating platform polyethylene insert.  INDICATIONS FOR SURGERY: Mckenzie Roberts is a 46 y.o. year old female who previously underwent a right medial unicompartmental knee arthroplasty in the room. She had persistent pain. Bone scan demonstrated increased uptake suggestive of loose implants.The patient had not seen any significant improvement despite conservative nonsurgical intervention. After discussion of the risks and benefits of surgical intervention, the patient expressed understanding of the risks benefits and agree with plans for total knee revision arthroplasty.   The risks, benefits, and alternatives were discussed at length including but not limited to the risks of infection, bleeding, nerve injury, stiffness, blood clots, the need for revision surgery, cardiopulmonary complications, among others, and they were willing to  proceed.  PROCEDURE IN DETAIL: The patient was brought into the operating room and, after adequate spinal anesthesia was achieved, a tourniquet was placed on the patient's upper thigh. The patient's knee and leg were cleaned and prepped with alcohol and DuraPrep and draped in the usual sterile fashion. A "timeout" was performed as per usual protocol. The lower extremity was exsanguinated using an Esmarch, and the tourniquet was inflated to 300 mmHg. An anterior longitudinal incision was made followed by a standard mid vastus approach. Approximately 5 mL of clear synovial fluid was aspirated from the joint before making the arthrotomy and the specimen was submitted for stat Gram stain cultures, complete cell count, and evaluation for crystals. The deep fibers of the medial collateral ligament were elevated in a subperiosteal fashion off of the medial flare of the tibia so as to maintain a continuous soft tissue sleeve. The patella was subluxed laterally and the patellofemoral ligament was incised. Inspection of the knee demonstrated degenerative changes to the patellofemoral articulation. There is also bony overgrowth of the medial femoral implant. Osteophytes were debrided using a rongeur. Anterior and posterior cruciate ligaments were excised.   Attention was directed to removal of the unicompartmental implants. A curet was used to debride soft tissue and bone from around the periphery of the femoral and tibial implants. Thin osteotomes were then used to develop the plane between the implant and the cement, thus loosening the bond. The tibial component was then removed with essentially no loss of bone. There was an area along the posterior margin consistent with some loosening and softening of the underlying bone. In a similar fashion, the interface between the femoral implant and the cement was developed using the thin osteotomes and the femoral component was removed.  A pilot hole was created just anterior  to the insertion of the anterior cruciate  ligament and the intramedullary rod was placed down the femoral canal. The distal femoral cutting guide was positioned so as to achieve a 5 distal valgus cut. The femur was sized and it was felt that a size 2.5 femoral component was appropriate. A size 2.5 femoral cutting guide was positioned and the anterior cut was performed and verified using the computer. This was followed by completion of the posterior and chamfer cuts. Femoral cutting guide for the central box was then positioned in the center box cut was performed.  Attention was then directed to the proximal tibia. Remnants of the medial and lateral menisci were excised. The extramedullary tibial cutting guide was positioned so as to achieve a 0 varus-valgus alignment and 0 posterior slope. The cut was performed using an oscillating saw. The proximal tibia was sized and it was felt that a size 2.5 tibial tray was appropriate. Tibial and femoral trials were inserted followed by insertion of a 10 mm polyethylene insert. This allowed for excellent mediolateral soft tissue balancing both in flexion and in full extension. Finally, the patella was cut and prepared so as to accommodate a 35 mm 3 peg oval dome patella. A patella trial was placed and the knee was placed through a range of motion with excellent patellar tracking appreciated. The femoral trial was removed after debridement of posterior osteophytes. The central post-hole for the tibial component was reamed followed by insertion of a keel punch. Tibial trials were then removed. Cut surfaces of bone were irrigated with copious amounts of normal saline with antibiotic solution using pulsatile lavage and then suctioned dry. Polymethylmethacrylate cement with gentamicin was prepared in the usual fashion using a vacuum mixer. Cement was applied to the cut surface of the proximal tibia as well as along the undersurface of a size 2.5 MBT tibial component. Tibial  component was positioned and impacted into place. Excess cement was removed using Personal assistant. Cement was then applied to the cut surfaces of the femur as well as along the posterior flanges of the size 2.5 femoral component. The femoral component was positioned and impacted into place. Excess cement was removed using Personal assistant. A 10 mm polyethylene trial was inserted and the knee was brought into full extension with steady axial compression applied. Finally, cement was applied to the backside of a 351 mm 3 peg oval dome patella and the patellar component was positioned and patellar clamp applied. Excess cement was removed using Personal assistant. After adequate curing of the cement, the tourniquet was deflated after a total tourniquet time of 165 minutes. Hemostasis was achieved using electrocautery. The knee was irrigated with copious amounts of normal saline with antibiotic solution using pulsatile lavage and then suctioned dry. 20 mL of 1.3% Exparel and 60 mL of 0.25% Marcaine in 40 mL of normal saline was injected along the posterior capsule, medial and lateral gutters, and along the arthrotomy site. A 10 mm stabilized rotating platform polyethylene insert was inserted and the knee was placed through a range of motion with excellent mediolateral soft tissue balancing appreciated and excellent patellar tracking noted. 2 medium drains were placed in the wound bed and brought out through separate stab incisions. The medial parapatellar portion of the incision was reapproximated using interrupted sutures of #1 Vicryl. Subcutaneous tissue was approximated in layers using first #0 Vicryl followed #2-0 Vicryl. The skin was approximated with skin staples. A sterile dressing was applied.  The patient tolerated the procedure well and was transported to the recovery room in stable  condition.    James P. Angie Fava., M.D.

## 2017-06-22 NOTE — H&P (Signed)
The patient has been re-examined, and the chart reviewed, and there have been no interval changes to the documented history and physical.    The risks, benefits, and alternatives have been discussed at length. The patient expressed understanding of the risks benefits and agreed with plans for surgical intervention.  James P. Hooten, Jr. M.D.    

## 2017-06-22 NOTE — Anesthesia Procedure Notes (Signed)
Date/Time: 06/22/2017 7:53 AM Performed by: Junious Silk Pre-anesthesia Checklist: Patient identified, Emergency Drugs available, Suction available, Patient being monitored and Timeout performed Oxygen Delivery Method: Simple face mask

## 2017-06-22 NOTE — Anesthesia Post-op Follow-up Note (Signed)
Anesthesia QCDR form completed.        

## 2017-06-23 MED ORDER — ENOXAPARIN SODIUM 30 MG/0.3ML ~~LOC~~ SOLN: 40.0000 mg | SUBCUTANEOUS | 0 refills | Status: DC

## 2017-06-23 MED ORDER — OXYCODONE HCL 5 MG PO TABS: 5.0000 mg | ORAL_TABLET | ORAL | 0 refills | Status: DC | PRN

## 2017-06-23 MED ORDER — TRAMADOL HCL 50 MG PO TABS: 50.0000 mg | ORAL_TABLET | ORAL | 0 refills | Status: DC | PRN

## 2017-06-23 MED ORDER — LACTULOSE 10 GM/15ML PO SOLN
10.0000 g | Freq: Two times a day (BID) | ORAL | Status: DC | PRN
  Administered 2017-06-23 (×2): 10 g via ORAL
  Filled 2017-06-23 (×2): qty 30

## 2017-06-23 NOTE — Discharge Summary (Signed)
Physician Discharge Summary  Patient ID: Mckenzie Roberts MRN: [id number] DOB/AGE: May 17, 1971 46 y.o.  Admit date: 06/22/2017 Discharge date: 06/24/2017  Admission Diagnoses:  LOOSE ORTOPEDIC IMPLANT    Discharge Diagnoses: Patient Active Problem List   Diagnosis Date Noted  . S/P total knee arthroplasty 06/22/2017    Past Medical History:  Diagnosis Date  . Depression   . Hyperlipemia   . Stroke [facility])    TIA     Transfusion: No transfusions during this admission   Consultants (if any):   Discharged Condition: Improved  Hospital Course: Mckenzie Roberts is an 46 y.o. female who was admitted 06/22/2017 with a diagnosis of degenerative arthrosis right knee and went to the operating room on 06/22/2017 and underwent the above named procedures.    Surgeries:Procedure(s): TOTAL KNEE REVISION on 06/22/2017  PRE-OPERATIVE DIAGNOSIS: Painful, loose right medial unicompartmental knee arthroplasty  POST-OPERATIVE DIAGNOSIS:  Same   PROCEDURE:  Revision of a right medial unicompartmental knee arthroplasty to a right total knee arthroplasty (removal of unicompartmental knee implants)  SURGEON:  Jena Gauss. M.D.  ASSISTANT:  Van Clines, PA (present and scrubbed throughout the case, critical for assistance with exposure, retraction, instrumentation, and closure)  ANESTHESIA: spinal  ESTIMATED BLOOD LOSS: 50 mL  FLUIDS REPLACED: 2100 mL of crystalloid  TOURNIQUET TIME: 165 minutes  DRAINS: 2 medium Hemovac drains  SOFT TISSUE RELEASES: Anterior cruciate ligament, posterior cruciate ligament, deep medial collateral ligament, patellofemoral ligament  IMPLANTS UTILIZED: DePuy PFC Sigma size 2.5 posterior stabilized femoral component (cemented), size 2.5 MBT tibial component (cemented), 35 mm 3 peg oval dome patella (cemented), and a 10 mm stabilized rotating platform polyethylene insert.  INDICATIONS FOR SURGERY: Mckenzie Roberts is a 46 y.o. year old female who previously  underwent a right medial unicompartmental knee arthroplasty in the room. She had persistent pain. Bone scan demonstrated increased uptake suggestive of loose implants.The patient had not seen any significant improvement despite conservative nonsurgical intervention. After discussion of the risks and benefits of surgical intervention, the patient expressed understanding of the risks benefits and agree with plans for total knee revision arthroplasty.   The risks, benefits, and alternatives were discussed at length including but not limited to the risks of infection, bleeding, nerve injury, stiffness, blood clots, the need for revision surgery, cardiopulmonary complications, among others, and they were willing to proceed.  Patient tolerated the surgery well. No complications .Patient was taken to PACU where she was stabilized and then transferred to the orthopedic floor.  Patient started on Lovenox 30 mg q 12 hrs. Foot pumps applied bilaterally at 80 mm hgb. Heels elevated off bed with rolled towels. No evidence of DVT. Calves non tender. Negative Homan. Physical therapy started on day #1 for gait training and transfer with OT starting on  day #1 for ADL and assisted devices. Patient has done well with therapy. Ambulated greater than 200 feet upon being discharged. Was able to ascend and descend 4 steps safely and independently  Patient's IV And Foley were discontinued on day #1 with Hemovac being discontinued on day #2. Dressing was changed on day 2 prior to patient being discharged   She was given perioperative antibiotics:  Anti-infectives    Start     Dose/Rate Route Frequency Ordered Stop   06/22/17 1730  ceFAZolin (ANCEF) IVPB 2g/100 mL premix     2 g 200 mL/hr over 30 Minutes Intravenous Every 6 hours 06/22/17 1411 06/23/17 1729   06/22/17 0642  ceFAZolin (ANCEF) 2-4 GM/100ML-% IVPB  Comments:  Letta Pate   : cabinet override      06/22/17 1308 06/22/17 0743   06/22/17 0600   ceFAZolin (ANCEF) IVPB 2g/100 mL premix     2 g 200 mL/hr over 30 Minutes Intravenous On call to O.R. 06/21/17 2151 06/22/17 1140    .  She was fitted with AV 1 compression foot pump devices, instructed on heel pumps, early ambulation, and fitted with TED stockings bilaterally for DVT prophylaxis.  She benefited maximally from the hospital stay and there were no complications.    Recent vital signs:  Vitals:   06/23/17 0050 06/23/17 0422  BP: 127/68 123/65  Pulse: 91 (!) 102  Resp: 18 19  Temp: 98.8 F (37.1 C) 99.2 F (37.3 C)  SpO2: 100% 99%    Recent laboratory studies:  Lab Results  Component Value Date   HGB 13.5 06/10/2017   Lab Results  Component Value Date   WBC 8.8 06/10/2017   PLT 297 06/10/2017   Lab Results  Component Value Date   INR 0.98 06/10/2017   Lab Results  Component Value Date   NA 137 06/10/2017   K 3.9 06/10/2017   CL 105 06/10/2017   CO2 25 06/10/2017   BUN 11 06/10/2017   CREATININE 1.17 (H) 06/10/2017   GLUCOSE 121 (H) 06/10/2017    Discharge Medications:   Allergies as of 06/23/2017      Reactions   Niacin Other (See Comments)   Flushing- Syncope reaction after taking dose increased to 1000 mg 3 x day.   Sulfa Antibiotics Rash      Medication List    STOP taking these medications   aspirin EC 81 MG tablet     TAKE these medications   ALEVE PM 220-25 MG Tabs Generic drug:  Naproxen Sod-Diphenhydramine Take 1 tablet by mouth at bedtime as needed (sleep).   buPROPion 150 MG 24 hr tablet Commonly known as:  WELLBUTRIN XL Take 150 mg by mouth daily.   enoxaparin 30 MG/0.3ML injection Commonly known as:  LOVENOX Inject 0.4 mLs (40 mg total) into the skin daily.   loratadine 10 MG tablet Commonly known as:  CLARITIN Take 10 mg by mouth daily.   oxyCODONE 5 MG immediate release tablet Commonly known as:  Oxy IR/ROXICODONE Take 1-2 tablets (5-10 mg total) by mouth every 4 (four) hours as needed for severe pain or  breakthrough pain.   rosuvastatin 5 MG tablet Commonly known as:  CRESTOR Take 5 mg by mouth once.   traMADol 50 MG tablet Commonly known as:  ULTRAM Take 1-2 tablets (50-100 mg total) by mouth every 4 (four) hours as needed for moderate pain.            Durable Medical Equipment        Start     Ordered   06/22/17 1412  DME Walker rolling  Once    Question:  Patient needs a walker to treat with the following condition  Answer:  Total knee replacement status   06/22/17 1411   06/22/17 1412  DME Bedside commode  Once    Question:  Patient needs a bedside commode to treat with the following condition  Answer:  Total knee replacement status   06/22/17 1411       Discharge Care Instructions        Start     Ordered   06/23/17 0000  enoxaparin (LOVENOX) 30 MG/0.3ML injection  Every 24 hours     06/23/17  4098   06/23/17 0000  oxyCODONE (OXY IR/ROXICODONE) 5 MG immediate release tablet  Every 4 hours PRN     06/23/17 0754   06/23/17 0000  traMADol (ULTRAM) 50 MG tablet  Every 4 hours PRN     06/23/17 0754   06/23/17 0000  Increase activity slowly     06/23/17 0754   06/23/17 0000  Diet - low sodium heart healthy     06/23/17 0754      Diagnostic Studies: Dg Knee Right Port  Result Date: 06/22/2017 CLINICAL DATA:  Total knee replacement EXAM: PORTABLE RIGHT KNEE - 1-2 VIEW COMPARISON:  None. FINDINGS: Two-view exam shows the patient be status post tricompartmental knee replacement. No evidence for immediate hardware complications. Surgical drains overlie the anterior soft tissues of the inferior thigh. IMPRESSION: No evidence for immediate hardware complications. Electronically Signed   By: Kennith Center M.D.   On: 06/22/2017 13:09    Disposition: Final discharge disposition not confirmed  Discharge Instructions    Diet - low sodium heart healthy    Complete by:  As directed    Increase activity slowly    Complete by:  As directed       Follow-up Information     Tera Partridge, PA On 07/07/2017.   Specialty:  Physician Assistant Why:  at 1:15pm Contact information: [address] [facility] [place] [address] [phone]        Donato Heinz, MD On 08/04/2017.   Specialty:  Orthopedic Surgery Why:  at 10:00am Contact information: [address] [facility] [place] [address] [phone]            Signed: Tera Partridge 06/23/2017, 7:55 AM

## 2017-06-23 NOTE — Evaluation (Addendum)
Occupational Therapy Evaluation Patient Details Name: Mckenzie Roberts MRN: [id number] DOB: 05/25/1971 Today's Date: 06/23/2017    History of Present Illness 46 y/o s/p Revision of a right medial unicompartmental knee arthroplasty to a right total knee arthroplasty (removal of unicompartmental knee implants) 9/17.   Clinical Impression   Patient seen for OT evaluation this date, she lives with her husband in a one story home with 2 steps to enter.  She has a 33 yo daughter who will also help if needed and her mom and dad are nearby.  She was previously independent with all self care tasks, homemaking, driving and working full time in an office doing computer work.  She presents with muscle weakness, decreased ROM in right knee, decreased transfers/functional mobility and decreased ability to perform self care tasks. She would benefit from skilled OT to maximize safety and independence in daily tasks to return home with her family. She had prior knee surgery and has all necessary equipment.  Do not anticipate further OT needs at discharge.     Follow Up Recommendations  No OT follow up    Equipment Recommendations       Recommendations for Other Services       Precautions / Restrictions Precautions Precautions: Fall;Knee Restrictions Weight Bearing Restrictions: Yes RLE Weight Bearing: Weight bearing as tolerated      Mobility Bed Mobility Overal bed mobility: Modified Independent             General bed mobility comments: Pt able to get LEs back into bed w/o phyiscal assist, did not need L LE to help R.  Transfers Overall transfer level: Modified independent Equipment used: Rolling walker (2 wheeled)             General transfer comment: min guard for mobility to and from bathroom and min guard for toilet transfer without raised seat    Balance Overall balance assessment: Modified Independent                  Patient seen for LB dressing with Adaptive equipment,  use of reacher to doff right sock, use of sockaid to don right sock.  She is independent with left sock.  With toileting she did require cues to pull up underwear after toileting, min guard.                        ADL either performed or assessed with clinical judgement   ADL Overall ADL's : Needs assistance/impaired Eating/Feeding: Modified independent   Grooming: Modified independent;Sitting Grooming Details (indicate cue type and reason): min guard standing Upper Body Bathing: Modified independent   Lower Body Bathing: Minimal assistance   Upper Body Dressing : Modified independent   Lower Body Dressing: Minimal assistance   Toilet Transfer: Min guard   Toileting- Clothing Manipulation and Hygiene: Min guard Toileting - Clothing Manipulation Details (indicate cue type and reason): cues for remembering to pull up underwear after toileting.     Functional mobility during ADLs: Min guard       Vision   Additional Comments: wears contacts     Perception     Praxis      Pertinent Vitals/Pain Pain Assessment: 0-10 Pain Score: 4  Pain Location: right knee Pain Descriptors / Indicators: Aching Pain Intervention(s): Limited activity within patient's tolerance;Repositioned;Monitored during session     Hand Dominance Right   Extremity/Trunk Assessment Upper Extremity Assessment Upper Extremity Assessment: Overall WFL for tasks assessed   Lower Extremity  Assessment Lower Extremity Assessment: RLE deficits/detail RLE Deficits / Details: RTKR   Cervical / Trunk Assessment Cervical / Trunk Assessment: Normal   Communication Communication Communication: No difficulties   Cognition Arousal/Alertness: Awake/alert Behavior During Therapy: WFL for tasks assessed/performed Overall Cognitive Status: Within Functional Limits for tasks assessed                                 General Comments: Patient states she felt "loopy" at times, forgot to pull  up her underwear after toileting and required cues.   General Comments          Shoulder Instructions      Home Living Family/patient expects to be discharged to:: Private residence Living Arrangements: Spouse/significant other Available Help at Discharge: Family Type of Home: House Home Access: Stairs to enter Secretary/administrator of Steps: 2 Entrance Stairs-Rails: Right Home Layout: One level     Bathroom Shower/Tub: Walk-in shower;Door   Bathroom Toilet: Handicapped height Bathroom Accessibility: Yes   Home Equipment: Environmental consultant - 2 wheels;Shower seat   Additional Comments: has a reacher, sock aid, shoehorn and sponge from previous surgery      Prior Functioning/Environment Level of Independence: Independent                 OT Problem List: Decreased strength;Pain;Decreased range of motion;Decreased knowledge of use of DME or AE      OT Treatment/Interventions: Self-care/ADL training;DME and/or AE instruction;Therapeutic activities;Therapeutic exercise;Patient/family education    OT Goals(Current goals can be found in the care plan section) Acute Rehab OT Goals Patient Stated Goal: patient would like to be independent and take care of herself.  OT Goal Formulation: With patient Time For Goal Achievement: 07/04/17 Potential to Achieve Goals: Good  OT Frequency: Min 1X/week   Barriers to D/C:            Co-evaluation              AM-PAC PT "6 Clicks" Daily Activity     Outcome Measure Help from another person eating meals?: None Help from another person taking care of personal grooming?: None Help from another person toileting, which includes using toliet, bedpan, or urinal?: A Little Help from another person bathing (including washing, rinsing, drying)?: A Little Help from another person to put on and taking off regular upper body clothing?: None Help from another person to put on and taking off regular lower body clothing?: A Little 6 Click  Score: 21   End of Session Equipment Utilized During Treatment: Rolling walker;Gait belt  Activity Tolerance: Patient tolerated treatment well Patient left: in chair;with call bell/phone within reach;with family/visitor present;with chair alarm set  OT Visit Diagnosis: Muscle weakness (generalized) (M62.81);Pain Pain - Right/Left: Right Pain - part of body: Knee                Time: 1620-1650 OT Time Calculation (min): 30 min Charges:  OT General Charges $OT Visit: 1 Visit OT Evaluation $OT Eval Low Complexity: 1 Low OT Treatments $Self Care/Home Management : 8-22 mins G-Codes:     Jaxx Huish T Alastair Hennes, OTR/L, CLT   Niccolo Burggraf 06/23/2017, 4:59 PM

## 2017-06-23 NOTE — Care Management Note (Signed)
Case Management Note  Patient Details  Name: Mckenzie Roberts MRN: [id number] Date of Birth: 01-31-1971  Subjective/Objective: RNCM consult for discharge planning.POD # 1 right TKR.  Patient has worked with PT who is recommending home health. May progress to outpatient home health. Will see patient this afternoon following further recommendations from PT   Action/Plan:   Expected Discharge Date:  06/24/17               Expected Discharge Plan:     In-House Referral:     Discharge planning Services  CM Consult  Post Acute Care Choice:    Choice offered to:     DME Arranged:    DME Agency:     HH Arranged:    HH Agency:     Status of Service:  In process, will continue to follow  If discussed at Long Length of Stay Meetings, dates discussed:    Additional Comments:  Marily Memos, RN 06/23/2017, 10:54 AM

## 2017-06-23 NOTE — Care Management Note (Signed)
Case Management Note  Patient Details  Name: Mckenzie Roberts MRN: [id number] Date of Birth: [birth year]  Subjective/Objective: POD # 1 right TKA. Patient lives at home with her spouse. She has a walker. Offered choice of home health agencies. Patient has no preference. Referral to [facility] for HHPT [facility] office)  Pharmacy: [facility] [phone]).                    Action/Plan: [facility] for HHPT. No DME.   Expected Discharge Date:  06/24/17               Expected Discharge Plan:  Home w Home Health Services  In-House Referral:     Discharge planning Services  CM Consult  Post Acute Care Choice:  Home Health Choice offered to:  Patient  DME Arranged:    DME Agency:     HH Arranged:  PT HH Agency:  [facility]  Status of Service:  In process, will continue to follow  If discussed at Long Length of Stay Meetings, dates discussed:    Additional Comments:  Marily Memos, RN 06/23/2017, 3:18 PM

## 2017-06-23 NOTE — Progress Notes (Signed)
   Subjective: 1 Day Post-Op Procedure(s) (LRB): TOTAL KNEE REVISION (Right) Patient reports pain as moderate.   Patient is well, and has had no acute complaints or problems. Patient still states that the "right leg is asleep". We will start therapy today.  Plan is to go Home after hospital stay. no nausea and no vomiting Patient denies any chest pains or shortness of breath. Patient would like to go home today.  Objective: Vital signs in last 24 hours: Temp:  [97.6 F (36.4 C)-99.2 F (37.3 C)] 99.2 F (37.3 C) (09/18 0422) Pulse Rate:  [70-102] 102 (09/18 0422) Resp:  [12-20] 19 (09/18 0422) BP: (108-132)/(62-92) 123/65 (09/18 0422) SpO2:  [98 %-100 %] 99 % (09/18 0422) Heels are non tender and elevated off the bed using rolled towels along with bone foam under operative leg.  Intake/Output from previous day: 09/17 0701 - 09/18 0700 In: 5276.7 [P.O.:760; I.V.:3816.7; IV Piggyback:700] Out: 5690 [Urine:5400; Drains:240; Blood:50] Intake/Output this shift: No intake/output data recorded.  No results for input(s): HGB in the last 72 hours. No results for input(s): WBC, RBC, HCT, PLT in the last 72 hours. No results for input(s): NA, K, CL, CO2, BUN, CREATININE, GLUCOSE, CALCIUM in the last 72 hours. No results for input(s): LABPT, INR in the last 72 hours.  EXAM General - Patient is Alert, Appropriate and Oriented Extremity - Intact pulses distally Dorsiflexion/Plantar flexion intact Compartment soft Patient is sensitive to cold touch. Able to fire muscles of the lower extremity. Dressing - dressing C/D/I Motor Function - intact, moving foot and toes well on exam.  Able to do straight leg raise on own.  Past Medical History:  Diagnosis Date  . Depression   . Hyperlipemia   . Stroke (HCC)    TIA    Assessment/Plan: 1 Day Post-Op Procedure(s) (LRB): TOTAL KNEE REVISION (Right) Active Problems:   S/P total knee arthroplasty  Estimated body mass index is 38.62  kg/m as calculated from the following:   Height as of this encounter:  (1.626 m).   Weight as of this encounter: 102.1 kg (225 lb). Advance diet Up with therapy D/C IV fluids Discharge home with home health  Labs: None DVT Prophylaxis - Lovenox, Foot Pumps and TED hose Weight-Bearing as tolerated to right leg D/C O2 and Pulse OX and try on Room Air Begin working on bowel movement. We will add lactulose If patient meets all requirement of home which includes walking a lap around the nurse's desk, doing steps having a bowel movement and showing that she can get in and out of a chair results safely she may get up today. Please call if she does. We'll need to remove Hemovac change dressing.  Lynnda Shields. [facility] PA [facility] 06/23/2017, 7:43 AM

## 2017-06-23 NOTE — Evaluation (Signed)
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  Clinical Impression  Pt is eager to work with PT and did very well with bed mobility and getting to standing.  She showed expected weakness and stiffness post TKA, but generally was functional t/o.  She still has some post-op tingling in R LE as well as increased nausea both of which limited how much walking she was comfortable doing.  Pt motivated to do a lot more this afternoon when she hopes to be feeling much better.  Pt showed great effort with ~10 minutes of LE exercises apart from the exam.      Follow Up Recommendations Home health PT (per progress, may be able to do outpatient with improvement)    Equipment Recommendations       Recommendations for Other Services       Precautions / Restrictions Precautions Precautions: Fall;Knee Restrictions Weight Bearing Restrictions: Yes RLE Weight Bearing: Weight bearing as tolerated      Mobility  Bed Mobility Overal bed mobility: Modified Independent             General bed mobility comments: Pt able to get herself to sitting EOB wo/ direct assist  Transfers Overall transfer level: Modified independent Equipment used: Rolling walker (2 wheeled)             General transfer comment: Pt was able to rise w/o direct assist, she was able to maintain balance with only light UE use on walker, leaning legs back on bed she maintained balance w/o UEs  Ambulation/Gait Ambulation/Gait assistance: Min guard Ambulation Distance (Feet): 20 Feet Assistive device: Rolling walker (2 wheeled)       General Gait Details: Pt showed good effort with ambulation and had no buckling or LOBs but felt nauseated and is still numb/tingling  in R LE to the point where she did not feel comfortable walking outside of the room  Stairs            Wheelchair Mobility    Modified Rankin (Stroke Patients Only)       Balance Overall balance assessment: Modified Independent                                           Pertinent Vitals/Pain Pain Assessment: 0-10 Pain Score: 6  Pain Location: R knee    Home Living Family/patient expects to be discharged to:: Private residence Living Arrangements: Spouse/significant other Available Help at Discharge: Family   Home Access: Stairs to enter Entrance Stairs-Rails: Right Entrance Stairs-Number of Steps: 2          Prior Function Level of Independence: Independent         Comments: Pt states she has generally been able to be active despite knee pain     Hand Dominance        Extremity/Trunk Assessment   Upper Extremity Assessment Upper Extremity Assessment: Overall WFL for tasks assessed    Lower Extremity Assessment Lower Extremity Assessment:  (expected post op weakness, able to lift RLE against gravity)       Communication   Communication: No difficulties  Cognition Arousal/Alertness: Awake/alert Behavior During Therapy: WFL for tasks assessed/performed Overall Cognitive  Status: Within Functional Limits for tasks assessed                                        General Comments      Exercises Total Joint Exercises Ankle Circles/Pumps: Strengthening;10 reps Quad Sets: Strengthening;10 reps Gluteal Sets: Strengthening;10 reps Heel Slides: Strengthening;AROM;5 reps Hip ABduction/ADduction: Strengthening;10 reps Straight Leg Raises: AAROM;AROM;10 reps Long Arc Quad: Strengthening;5 reps Knee Flexion: PROM;5 reps Goniometric ROM: 3-65   Assessment/Plan    PT Assessment Patient needs continued PT services  PT Problem List Decreased strength;Decreased range of motion;Decreased balance;Decreased activity  tolerance;Decreased mobility;Decreased knowledge of use of DME;Decreased safety awareness;Pain;Impaired sensation       PT Treatment Interventions DME instruction;Gait training;Stair training;Functional mobility training;Therapeutic activities;Therapeutic exercise;Balance training;Neuromuscular re-education;Patient/family education    PT Goals (Current goals can be found in the Care Plan section)  Acute Rehab PT Goals Patient Stated Goal: go home today PT Goal Formulation: With patient Time For Goal Achievement: 07/07/17 Potential to Achieve Goals: Good    Frequency BID   Barriers to discharge        Co-evaluation               AM-PAC PT "6 Clicks" Daily Activity  Outcome Measure Difficulty turning over in bed (including adjusting bedclothes, sheets and blankets)?: None Difficulty moving from lying on back to sitting on the side of the bed? : None Difficulty sitting down on and standing up from a chair with arms (e.g., wheelchair, bedside commode, etc,.)?: A Little Help needed moving to and from a bed to chair (including a wheelchair)?: None Help needed walking in hospital room?: A Little Help needed climbing 3-5 steps with a railing? : A Little 6 Click Score: 21    End of Session Equipment Utilized During Treatment: Gait belt Activity Tolerance: Patient tolerated treatment well Patient left: with chair alarm set;with call bell/phone within reach;with nursing/sitter in room Nurse Communication: Mobility status PT Visit Diagnosis: Muscle weakness (generalized) (M62.81);Difficulty in walking, not elsewhere classified (R26.2)    Time: 1610-9604 PT Time Calculation (min) (ACUTE ONLY): 29 min   Charges:   PT Evaluation $PT Eval Low Complexity: 1 Low PT Treatments $Therapeutic Exercise: 8-22 mins   PT G Codes:        Malachi Pro, DPT 06/23/2017, 10:13 AM

## 2017-06-23 NOTE — Progress Notes (Signed)
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    PT Comments    Pt very motivated to do prolonged walk as well as negotiate the steps this afternoon.  She is still having some c/o tingling in R foot/toes but is having minimal pain and not having any nausea.  She did have increased pain during ROM acts (pt seemed stiffer this afternoon with flexion activities) but overall did well.  She showed good effort with exercises and even though she needed a few warm up reps for against gravity and resisted activities she did well and displayed great motivation.    Follow Up Recommendations  Outpatient PT;Home health PT (per continued progress)     Equipment Recommendations       Recommendations for Other Services       Precautions / Restrictions Precautions Precautions: Fall;Knee Restrictions RLE Weight Bearing: Weight bearing as tolerated    Mobility  Bed Mobility Overal bed mobility: Modified Independent             General bed mobility comments: Pt able to get LEs back into bed w/o phyiscal assist, did not need L LE to help R.  Transfers Overall transfer level: Modified independent Equipment used: Rolling walker (2 wheeled)             General transfer comment: Pt was able to rise to standing w/o assist, no safety issues, good confidence.   Ambulation/Gait Ambulation/Gait assistance: Supervision Ambulation Distance (Feet): 250 Feet Assistive device: Rolling walker (2 wheeled)       General Gait Details: Pt motivated to fully circle the nurses' station and did so w/o any assist.  She did need light cuing to encourage consistent cadence and walker momentum, but ultimately did well.  Her O2 stayed in the high 90s  t/o the effort, HR did increase to nearly 120   Stairs Stairs: Yes   Stair Management: One rail Left Number of Stairs: 4 General stair comments: After minimal cuing for appropriate sequencing she was able to negotiate up/down w/o assist  Wheelchair Mobility    Modified Rankin (Stroke Patients Only)       Balance Overall balance assessment: Modified Independent                                          Cognition Arousal/Alertness: Awake/alert Behavior During Therapy: WFL for tasks assessed/performed Overall Cognitive Status: Within Functional Limits for tasks assessed                                        Exercises Total Joint Exercises Ankle Circles/Pumps: Strengthening;10 reps Quad Sets: Strengthening;10 reps Gluteal Sets: Strengthening;10 reps Short Arc Quad: Strengthening;10 reps;AROM Heel Slides: Strengthening;AROM;5 reps Hip ABduction/ADduction: Strengthening;10 reps Straight Leg Raises: AROM;10 reps Knee Flexion: PROM;5 reps Goniometric ROM: 1-64    General Comments        Pertinent Vitals/Pain Pain Score: 2  Pain Location: Pt only really having significant pain with ROM activity    Home Living  Prior Function            PT Goals (current goals can now be found in the care plan section) Progress towards PT goals: Progressing toward goals    Frequency    BID      PT Plan Current plan remains appropriate    Co-evaluation              AM-PAC PT "6 Clicks" Daily Activity  Outcome Measure  Difficulty turning over in bed (including adjusting bedclothes, sheets and blankets)?: None Difficulty moving from lying on back to sitting on the side of the bed? : None Difficulty sitting down on and standing up from a chair with arms (e.g., wheelchair, bedside commode, etc,.)?: None Help needed moving to and from a bed to chair (including a wheelchair)?: None Help needed walking in  hospital room?: None Help needed climbing 3-5 steps with a railing? : A Little 6 Click Score: 23    End of Session Equipment Utilized During Treatment: Gait belt Activity Tolerance: Patient tolerated treatment well Patient left: with call bell/phone within reach;with bed alarm set;with family/visitor present   PT Visit Diagnosis: Muscle weakness (generalized) (M62.81);Difficulty in walking, not elsewhere classified (R26.2)     Time: 1610-9604 PT Time Calculation (min) (ACUTE ONLY): 40 min  Charges:  $Gait Training: 23-37 mins $Therapeutic Exercise: 8-22 mins                    G Codes:       Malachi Pro, DPT 06/23/2017, 3:38 PM

## 2017-06-23 NOTE — Anesthesia Postprocedure Evaluation (Signed)
Anesthesia Post Note  Patient: Mckenzie Roberts  Procedure(s) Performed: Procedure(s) (LRB): TOTAL KNEE REVISION (Right)  Patient location during evaluation: Nursing Unit Anesthesia Type: Spinal Level of consciousness: awake and alert Pain management: pain level controlled Vital Signs Assessment: post-procedure vital signs reviewed and stable Respiratory status: spontaneous breathing Cardiovascular status: blood pressure returned to baseline Postop Assessment: no headache Anesthetic complications: no Comments: Concerned about tingling feeling in operative foot.  Has not been up yet or had PT.  Dr Ernest Pine saw her before I did and reassured her.  She has longer acting local in and around her knee.  Moves both feet and legs, has not voided yet.  Foley was removed :30.     Last Vitals:  Vitals:   06/23/17 0050 06/23/17 0422  BP: 127/68 123/65  Pulse: 91 (!) 102  Resp: 18 19  Temp: 37.1 C 37.3 C  SpO2: 100% 99%    Last Pain:  Vitals:   06/23/17 0521  TempSrc:   PainSc: Asleep                 Vernie Murders

## 2017-06-24 ENCOUNTER — Encounter: Payer: Self-pay | Admitting: Orthopedic Surgery

## 2017-06-24 NOTE — Progress Notes (Addendum)
Patient is being discharged home with family. Reviewed meds, last dose given, and scripts. IV removed with cath intact. Dressing changed, two dressing sent with patient. bilat TEDs are on. Discussed activity, diet, and S&S of pain meds. Allowed time for questions.  Lovenox teaching completed. Patient able to give her own injection this shift. Patient doesn't qualify for a walker d/t patient getting on from insurance in the past 2 years.

## 2017-06-24 NOTE — Progress Notes (Signed)
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   OT comments  Pt seen for brief OT treatment session this date with spouse present. Pt/spouse educated in polar care mgt and review of AE and examples of where to purchase, per RN request as pt had some questions. Pt/spouse verbalized understanding. Followed up with RN to confirm education was provided and relayed question from pt about status of a RW prior to discharge. RN to follow up with case mgr. Notified pt at end of session of status and RN in room as OT left.    Follow Up Recommendations  No OT follow up    Equipment Recommendations       Recommendations for Other Services      Precautions / Restrictions Precautions Precautions: Fall;Knee Precaution Booklet Issued: No Restrictions Weight Bearing Restrictions: Yes RLE Weight Bearing: Weight bearing as tolerated       Mobility Bed Mobility              General bed mobility comments: Pt sitting in chair at start and end of session  Transfers              General transfer comment: Safe technique with proper hand placement.  No cues or physical assist required.  Pt slower to stand.  She demonstrates good control during descent to sit.     Balance                            ADL either performed or assessed with clinical judgement   ADL Overall ADL's : Needs assistance/impaired Eating/Feeding: Modified independent   Grooming: Modified independent;Sitting Grooming Details (indicate cue type and reason): min guard standing Upper Body Bathing: Modified independent   Lower Body Bathing: Minimal assistance   Upper Body Dressing : Modified independent       Toilet Transfer: Min guard    Toileting- Clothing Manipulation and Hygiene: Min guard Toileting - Clothing Manipulation Details (indicate cue type and reason): cues for remembering to pull up underwear after toileting.     Functional mobility during ADLs: Min guard General ADL Comments: pt/spouse educated in polar care mgt and review of AE and where to purchase, per RN request as pt had some questions. Pt/spouse verbalized understanding.     Vision Baseline Vision/History: No visual deficits Patient Visual Report: No change from baseline Additional Comments: wears contacts   Perception     Praxis      Cognition Arousal/Alertness: Awake/alert Behavior During Therapy: WFL for tasks assessed/performed Overall Cognitive Status: Within Functional Limits for tasks assessed                                          Exercises    Shoulder Instructions       General Comments      Pertinent Vitals/ Pain       Pain Assessment: No/denies pain   Home Living  Prior Functioning/Environment              Frequency  Min 1X/week        Progress Toward Goals  OT Goals(current goals can now be found in the care plan section)  Progress towards OT goals: Progressing toward goals  Acute Rehab OT Goals Patient Stated Goal: to get stronger and regain independence  Plan Discharge plan remains appropriate    Co-evaluation                 AM-PAC PT "6 Clicks" Daily Activity     Outcome Measure   Help from another person eating meals?: None Help from another person taking care of personal grooming?: None Help from another person toileting, which includes using toliet, bedpan, or urinal?: A Little Help from another person bathing (including washing, rinsing, drying)?: A Little Help from another person to put on and taking off regular upper body clothing?: None Help from another person to put on and taking off regular lower  body clothing?: A Little 6 Click Score: 21    End of Session    OT Visit Diagnosis: Muscle weakness (generalized) (M62.81);Pain Pain - Right/Left: Right Pain - part of body: Knee   Activity Tolerance Patient tolerated treatment well   Patient Left in chair;with call bell/phone within reach;with chair alarm set;with family/visitor present;with nursing/sitter in room   Nurse Communication Other (comment) (spoke to RN re: status of RW for pt prior to discharge and when she could get the IV out)        Time: 1100-1111 OT Time Calculation (min): 11 min  Charges: OT General Charges $OT Visit: 1 Visit OT Treatments $Self Care/Home Management : 8-22 mins  Richrd Prime, MPH, MS, OTR/L ascom [phone] 06/24/17, 11:18 AM

## 2017-06-24 NOTE — Progress Notes (Signed)
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    PT Comments    Pt continues to make good progress with mobility.  She achieved 90 deg R knee flexion with AAROM therapeutic exercise.  Supervision provided for safety while ambulating with improving gait mechanics.     Follow Up Recommendations  Home health PT     Equipment Recommendations  None recommended by PT    Recommendations for Other Services       Precautions / Restrictions Precautions Precautions: Fall;Knee Precaution Booklet Issued: No Precaution Comments: Reviewed no pillow under knee Restrictions Weight Bearing Restrictions: Yes RLE Weight Bearing: Weight bearing as tolerated    Mobility  Bed Mobility               General bed mobility comments: Pt sitting in chair at start and end of session  Transfers Overall transfer level: Modified independent Equipment used: Rolling walker (2 wheeled)             General transfer comment: Safe technique with proper hand placement.  No cues or physical assist required.  Pt slower to stand.  She demonstrates good control during descent to sit.   Ambulation/Gait Ambulation/Gait assistance: Supervision Ambulation Distance (Feet): 225 Feet Assistive device: Rolling walker (2 wheeled) Gait Pattern/deviations: Step-through pattern;Decreased stance time - right;Decreased step length - left;Decreased weight shift to right;Antalgic Gait velocity: decreased Gait velocity interpretation: Below normal speed for age/gender General Gait Details: Cues to relax shoulders and to keep RW moving.  Supervision for safety.     Stairs            Wheelchair Mobility    Modified Rankin (Stroke Patients Only)        Balance Overall balance assessment: Needs assistance Sitting-balance support: No upper extremity supported;Feet supported Sitting balance-Leahy Scale: Good     Standing balance support: No upper extremity supported;During functional activity Standing balance-Leahy Scale: Fair Standing balance comment: Pt able to stand statically without UE support but relies on UE support for dynamic activities                            Cognition Arousal/Alertness: Awake/alert Behavior During Therapy: WFL for tasks assessed/performed Overall Cognitive Status: Within Functional Limits for tasks assessed                                        Exercises Total Joint Exercises Ankle Circles/Pumps: 10 reps;AROM;Both;Seated Quad Sets: Strengthening;10 reps;Both;Seated Long Arc Quad: Strengthening;Right;10 reps;Seated;Other (comment) (with focus on eccentric phase) Knee Flexion: AAROM;Right;Seated;10 reps;Other (comment) (with 5 second holds) Goniometric ROM: 90 deg R knee flexion  Other Exercises Other Exercises: Standing R knee flexion with UEs supported on RW.  x5 reps    General Comments        Pertinent Vitals/Pain Pain Assessment: Faces Faces Pain Scale: Hurts even more Pain Location: R knee with R knee flexion AROM exercise Pain Descriptors / Indicators: Grimacing;Discomfort;Aching Pain Intervention(s): Limited activity within patient's tolerance;Monitored during session;Premedicated before session;Ice applied    Home Living  Prior Function            PT Goals (current goals can now be found in the care plan section) Acute Rehab PT Goals Patient Stated Goal: to get stronger and regain independence PT Goal Formulation: With patient Time For Goal Achievement: 07/07/17 Potential to Achieve Goals: Good Progress towards PT goals: Progressing toward goals    Frequency    BID      PT Plan Current plan remains  appropriate    Co-evaluation              AM-PAC PT "6 Clicks" Daily Activity  Outcome Measure  Difficulty turning over in bed (including adjusting bedclothes, sheets and blankets)?: None Difficulty moving from lying on back to sitting on the side of the bed? : None Difficulty sitting down on and standing up from a chair with arms (e.g., wheelchair, bedside commode, etc,.)?: A Little Help needed moving to and from a bed to chair (including a wheelchair)?: A Little Help needed walking in hospital room?: A Little Help needed climbing 3-5 steps with a railing? : A Little 6 Click Score: 20    End of Session Equipment Utilized During Treatment: Gait belt Activity Tolerance: Patient tolerated treatment well Patient left: with call bell/phone within reach;in chair;with chair alarm set;Other (comment) (with bone foam and polar care in place) Nurse Communication: Mobility status PT Visit Diagnosis: Muscle weakness (generalized) (M62.81);Difficulty in walking, not elsewhere classified (R26.2)     Time: 1610-9604 PT Time Calculation (min) (ACUTE ONLY): 24 min  Charges:  $Gait Training: 8-22 mins $Therapeutic Exercise: 8-22 mins                    G Codes:       Encarnacion Chu PT, DPT 06/24/2017, 9:15 AM

## 2017-06-24 NOTE — Care Management Note (Signed)
Case Management Note  Patient Details  Name: Ramandeep Arington MRN: [id number] Date of Birth: [birth year]  Subjective/Objective:  Discharging today                  Action/Plan: Called lovenox 40 mg # 14 no refills to [facility], [place]. [facility] notified of Lovenox   Expected Discharge Date:  06/24/17               Expected Discharge Plan:  Home w Home Health Services  In-House Referral:     Discharge planning Services  CM Consult  Post Acute Care Choice:  Home Health Choice offered to:  Patient  DME Arranged:    DME Agency:     HH Arranged:  PT HH Agency:  [facility] (formerly [facility]  Status of Service:  Completed, signed off  If discussed at [employer], dates discussed:    Additional Comments:  Marily Memos, RN 06/24/2017, 9:41 AM

## 2017-06-24 NOTE — Progress Notes (Signed)
   Subjective: 2 Days Post-Op Procedure(s) (LRB): TOTAL KNEE REVISION (Right) Patient reports pain as moderate.   Patient is well, and has had no acute complaints or problems Patient did well with physical therapy yesterday. Met all goals. Continue physical therapy this morning Plan is to go Home after hospital stay. no nausea and no vomiting Patient denies any chest pains or shortness of breath. Patient states that the tingling sensation to the right lower extremity is improving. Yesterday she could not feel the compression foot pumps but can do so now. She still complains of little bit of tingling sensation to the dorsum aspect of the right foot as well as the great toe.  Objective: Vital signs in last 24 hours: Temp:  [98 F (36.7 C)-98.9 F (37.2 C)] 98.9 F (37.2 C) (09/18 1941) Pulse Rate:  [85-103] 98 (09/18 1941) Resp:  [18] 18 (09/18 1941) BP: (125-150)/(74-80) 125/74 (09/18 1941) SpO2:  [99 %-100 %] 100 % (09/18 1941) well approximated incision Heels are non tender and elevated off the bed using rolled towels Intake/Output from previous day: 09/18 0701 - 09/19 0700 In: 785 [P.O.:720; I.V.:65] Out: 240 [Drains:240] Intake/Output this shift: Total I/O In: -  Out: 120 [Drains:120]  No results for input(s): HGB in the last 72 hours. No results for input(s): WBC, RBC, HCT, PLT in the last 72 hours. No results for input(s): NA, K, CL, CO2, BUN, CREATININE, GLUCOSE, CALCIUM in the last 72 hours. No results for input(s): LABPT, INR in the last 72 hours.  EXAM General - Patient is Alert, Appropriate and Oriented Extremity - Neurologically intact Neurovascular intact Intact pulses distally Dorsiflexion/Plantar flexion intact No cellulitis present Compartment soft   Dressing - dressing C/D/I Motor Function - intact, moving foot and toes well on exam.    Past Medical History:  Diagnosis Date  . Depression   . Hyperlipemia   . Stroke ([place])    TIA     Assessment/Plan: 2 Days Post-Op Procedure(s) (LRB): TOTAL KNEE REVISION (Right) Active Problems:   S/P total knee arthroplasty  Estimated body mass index is 38.62 kg/m as calculated from the following:   Height as of this encounter: '5\' 4"'$  (1.626 m).   Weight as of this encounter: 102.1 kg (225 lb). Up with therapy Discharge home with home health  Labs: None DVT Prophylaxis - Lovenox, Foot Pumps and TED hose Weight-Bearing as tolerated to right leg Hemovac discontinued on today's visit Please wash the operative leg and apply TED stockings. Please change dressing prior to being discharged and give the patient 2 extra honeycomb dressings to take home.  Jillyn Ledger. [employer] [place] 06/24/2017, 6:49 AM

## 2017-06-25 LAB — BODY FLUID CULTURE: Culture: NO GROWTH

## 2017-06-27 LAB — AEROBIC/ANAEROBIC CULTURE (SURGICAL/DEEP WOUND): CULTURE: NO GROWTH

## 2017-06-27 LAB — AEROBIC/ANAEROBIC CULTURE W GRAM STAIN (SURGICAL/DEEP WOUND): Culture: NO GROWTH

## 2017-08-05 DIAGNOSIS — F418 Other specified anxiety disorders: Secondary | ICD-10-CM | POA: Insufficient documentation

## 2017-08-05 DIAGNOSIS — M722 Plantar fascial fibromatosis: Secondary | ICD-10-CM | POA: Insufficient documentation

## 2017-08-05 DIAGNOSIS — M6281 Muscle weakness (generalized): Secondary | ICD-10-CM | POA: Insufficient documentation

## 2017-12-02 ENCOUNTER — Encounter: Payer: Self-pay | Admitting: *Deleted

## 2017-12-22 ENCOUNTER — Ambulatory Visit: Payer: 59 | Admitting: General Surgery

## 2017-12-22 ENCOUNTER — Encounter: Payer: Self-pay | Admitting: General Surgery

## 2017-12-22 VITALS — BP 142/88 | HR 90 | Resp 12 | Ht 64.0 in | Wt 228.0 lb

## 2017-12-22 DIAGNOSIS — K8020 Calculus of gallbladder without cholecystitis without obstruction: Secondary | ICD-10-CM

## 2017-12-22 NOTE — Progress Notes (Signed)
Patient ID: Mckenzie Roberts, female   DOB: 12/11/1970, 47 y.o.   MRN: [id number]  Chief Complaint  Patient presents with  . Abdominal Pain    HPI Mckenzie Roberts is a 47 y.o. female.  Here for evaluation of her gall bladder referred by Dr Cheryll Dessert. She states a couple of months ago she started having pain about 1 hour after her meals occurring daily. The pain is center epigastric area lasting a short time to hours. She states the pain does not radiate. She states the nausea is usually immediately after meals, no vomiting. She states no particular foods trigger the pain, even coffee bothers her. Denies much indigestion or heartburn. Ultrasound was at [facility] showing a gall stone. 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  She is her with her husband, Fayrene Fearing.  [place] but is a native of [facility] [place].  HPI  Past Medical History:  Diagnosis Date  . Depression   . Hyperlipemia   . 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 Stroke [facility]) 10/2015   TIA    Past Surgical History:  Procedure Laterality Date  . KNEE ARTHROSCOPY Right 05/2015  . PARTIAL KNEE ARTHROPLASTY Right 02/2016  . TOTAL KNEE REVISION Right 06/22/2017   Procedure: TOTAL KNEE REVISION;  Surgeon: Donato Heinz, MD;  Location: [facility] ORS;  Service: Orthopedics;  Laterality: Right;    Family History  Problem Relation Age of Onset  . Diabetes Mother   . Kidney disease Mother   . Diabetes Father   . Hypertension Father   . Thyroid disease Sister     Social History Social History   Tobacco Use  . Smoking status: Never Smoker  . Smokeless tobacco: Never Used  Substance Use Topics  . Alcohol use: Yes    Alcohol/week: 1.2 oz    Types: 2 Glasses of wine per week  . Drug use: No    Allergies  Allergen Reactions  . Niacin Other (See Comments)    Flushing- Syncope reaction after taking dose increased to 1000 mg 3 x day.  . Sulfa Antibiotics Rash    Current  Outpatient Medications  Medication Sig Dispense Refill  . aspirin EC 81 MG tablet Take 81 mg by mouth daily.    Marland Kitchen buPROPion (WELLBUTRIN XL) 150 MG 24 hr tablet Take 150 mg by mouth daily.    Marland Kitchen loratadine (CLARITIN) 10 MG tablet Take 10 mg by mouth daily.    . Naproxen Sod-Diphenhydramine (ALEVE PM) 220-25 MG TABS Take 1 tablet by mouth at bedtime as needed (sleep).    . rosuvastatin (CRESTOR) 5 MG tablet Take 5 mg by mouth once.     No current facility-administered medications for this visit.     Review of Systems Review of Systems  Constitutional: Negative.   Respiratory: Negative.   Cardiovascular: Negative.   Gastrointestinal: Positive for abdominal pain and nausea. Negative for constipation, diarrhea and vomiting.    Blood pressure (!) 142/88, pulse 90, resp. rate 12, height 5\' 4"  (1.626 m), weight 228 lb (103.4 kg), last menstrual period 12/11/2017, SpO2 98 %.  Physical Exam Physical Exam  Constitutional: She is oriented to person, place, and time. She appears well-developed and well-nourished.  HENT:  Mouth/Throat: Oropharynx is clear and moist.  Eyes: Conjunctivae are normal. No scleral icterus.  Neck: Neck supple.  Cardiovascular: Normal rate, regular rhythm and normal heart sounds.  Pulmonary/Chest: Effort normal and breath sounds normal.  Abdominal: Soft. Normal  appearance and bowel sounds are normal. There is tenderness in the epigastric area.    Lymphadenopathy:    She has no cervical adenopathy.  Neurological: She is alert and oriented to person, place, and time.  Skin: Skin is warm and dry.  Psychiatric: Her behavior is normal.    Data Reviewed PCP notes of November 20, 2017 reviewed.  Laboratory studies from November 10, 2017 reviewed.  Normal electrolytes.  Normal renal function, creatinine 1.05, estimated GFR 73.  Mild persistent elevation of the ALT at 39.  Normal AST and alkaline phosphatase.  Normal bilirubin at 0.5.  Abdominal ultrasound verbal report  from the patient was a single gallstone was identified.  Films and report have been requested.  Assessment    History of cholelithiasis, intermittent symptoms suggestive of biliary colic.    Plan     Due to the persistence of her symptoms, it seems reasonable to proceed to elective cholecystectomy.   Laparoscopic Cholecystectomy with Intraoperative Cholangiogram. The procedure, including it's potential risks and complications (including but not limited to infection, bleeding, injury to intra-abdominal organs or bile ducts, bile leak, poor cosmetic result, sepsis and death) were discussed with the patient in detail. Non-operative options, including their inherent risks (acute calculous cholecystitis with possible choledocholithiasis or gallstone pancreatitis, with the risk of ascending cholangitis, sepsis, and death) were discussed as well. The patient expressed and understanding of what we discussed and wishes to proceed with laparoscopic cholecystectomy. The patient further understands that if it is technically not possible, or it is unsafe to proceed laparoscopically, that I will convert to an open cholecystectomy.     She does work from home and may not need a full week off work.  HPI, Physical Exam, Assessment and Plan have been scribed under the direction and in the presence of Mckenzie MayotteJeffrey W. Byrnett, MD. Mckenzie DaftMarsha Hatch, RN  I have completed the exam and reviewed the above documentation for accuracy and completeness.  I agree with the above.  [employer] has been used and any errors in dictation or transcription are unintentional.  Mckenzie Roberts, M.D., F.A.C.S.  The patient is scheduled [facility]. She will pre admit by phone. The patient is aware of date and instructions.   A [facility] to obtain ultrasound report and images. Documented by Mckenzie Roberts  Mckenzie Roberts 12/22/2017, 2:07 PM

## 2017-12-22 NOTE — Patient Instructions (Addendum)
Laparoscopic Cholecystectomy Laparoscopic cholecystectomy is surgery to remove the gallbladder. The gallbladder is a pear-shaped organ that lies beneath the liver on the right side of the body. The gallbladder stores bile, which is a fluid that helps the body to digest fats. Cholecystectomy is often done for inflammation of the gallbladder (cholecystitis). This condition is usually caused by a buildup of gallstones (cholelithiasis) in the gallbladder. Gallstones can block the flow of bile, which can result in inflammation and pain. In severe cases, emergency surgery may be required. This procedure is done though small incisions in your abdomen (laparoscopic surgery). A thin scope with a camera (laparoscope) is inserted through one incision. Thin surgical instruments are inserted through the other incisions. In some cases, a laparoscopic procedure may be turned into a type of surgery that is done through a larger incision (open surgery). Tell a health care provider about:  Any allergies you have.  All medicines you are taking, including vitamins, herbs, eye drops, creams, and over-the-counter medicines.  Any problems you or family members have had with anesthetic medicines.  Any blood disorders you have.  Any surgeries you have had.  Any medical conditions you have.  Whether you are pregnant or may be pregnant. What are the risks? Generally, this is a safe procedure. However, problems may occur, including:  Infection.  Bleeding.  Allergic reactions to medicines.  Damage to other structures or organs.  A stone remaining in the common bile duct. The common bile duct carries bile from the gallbladder into the small intestine.  A bile leak from the cyst duct that is clipped when your gallbladder is removed.  What happens before the procedure? Staying hydrated Follow instructions from your health care provider about hydration, which may include:  Up to 2 hours before the procedure -  you may continue to drink clear liquids, such as water, clear fruit juice, black coffee, and plain tea.  Eating and drinking restrictions Follow instructions from your health care provider about eating and drinking, which may include:  8 hours before the procedure - stop eating heavy meals or foods such as meat, fried foods, or fatty foods.  6 hours before the procedure - stop eating light meals or foods, such as toast or cereal.  6 hours before the procedure - stop drinking milk or drinks that contain milk.  2 hours before the procedure - stop drinking clear liquids.  Medicines  Ask your health care provider about: ? Changing or stopping your regular medicines. This is especially important if you are taking diabetes medicines or blood thinners. ? Taking medicines such as aspirin and ibuprofen. These medicines can thin your blood. Do not take these medicines before your procedure if your health care provider instructs you not to.  You may be given antibiotic medicine to help prevent infection. General instructions  Let your health care provider know if you develop a cold or an infection before surgery.  Plan to have someone take you home from the hospital or clinic.  Ask your health care provider how your surgical site will be marked or identified. What happens during the procedure?  To reduce your risk of infection: ? Your health care team will wash or sanitize their hands. ? Your skin will be washed with soap. ? Hair may be removed from the surgical area.  An IV tube may be inserted into one of your veins.  You will be given one or more of the following: ? A medicine to help you relax (sedative). ?  A medicine to make you fall asleep (general anesthetic).  A breathing tube will be placed in your mouth.  Your surgeon will make several small cuts (incisions) in your abdomen.  The laparoscope will be inserted through one of the small incisions. The camera on the laparoscope  will send images to a TV screen (monitor) in the operating room. This lets your surgeon see inside your abdomen.  Air-like gas will be pumped into your abdomen. This will expand your abdomen to give the surgeon more room to perform the surgery.  Other tools that are needed for the procedure will be inserted through the other incisions. The gallbladder will be removed through one of the incisions.  Your common bile duct may be examined. If stones are found in the common bile duct, they may be removed.  After your gallbladder has been removed, the incisions will be closed with stitches (sutures), staples, or skin glue.  Your incisions may be covered with a bandage (dressing). The procedure may vary among health care providers and hospitals. What happens after the procedure?  Your blood pressure, heart rate, breathing rate, and blood oxygen level will be monitored until the medicines you were given have worn off.  You will be given medicines as needed to control your pain.  Do not drive for 24 hours if you were given a sedative. This information is not intended to replace advice given to you by your health care provider. Make sure you discuss any questions you have with your health care provider. Document Released: 09/22/2005 Document Revised: 04/13/2016 Document Reviewed: 03/10/2016 Elsevier Interactive Patient Education  [employer]2018 Elsevier Inc.  The patient is aware to use a heating pad as needed for comfort.   The patient is [facility]. She will pre admit by phone. The patient is aware of date and instructions.   A [facility] to obtain ultrasound report and images.

## 2017-12-28 ENCOUNTER — Encounter: Payer: Self-pay | Admitting: General Surgery

## 2017-12-28 NOTE — Progress Notes (Signed)
Right upper quadrant [facility] completed at [facility] on November 27, 2017 is now available for review.  Single mobile gallstone.  Normal gallbladder wall thickness.  Normal biliary tract diameter, common bile duct 3.5 mm.  Evidence of fatty infiltration without change since 2011 by report.

## 2017-12-31 ENCOUNTER — Encounter
Admission: RE | Admit: 2017-12-31 | Discharge: 2017-12-31 | Disposition: A | Discharge status: Home / Self Care | Payer: 59 | Source: Ambulatory Visit | Attending: General Surgery | Admitting: General Surgery

## 2017-12-31 ENCOUNTER — Other Ambulatory Visit: Payer: Self-pay

## 2017-12-31 HISTORY — DX: Gastro-esophageal reflux disease without esophagitis: K21.9

## 2017-12-31 HISTORY — DX: Family history of other specified conditions: Z84.89

## 2017-12-31 NOTE — Pre-Procedure Instructions (Signed)
Echocardi[facility] 2D [facility]13/2017 [facility] Component Name Value Ref Range  LV Ejection Fraction (%) 55 %  Left Atrium Diameter (cm) 3.5 cm  LV End Diastolic Diameter (cm) 4.0 cm  LV End Systolic Diameter (cm) 2.4 cm  LV Septum Wall Thickness (cm) 1.1 cm  LV Posterior Wall Thickness (cm) 1.1 cm  Tricuspid Valve Regurgitation Grade trivial   Mitral Valve Regurgitation Grade none   Mitral Valve Stenosis Grade none   Aortic Valve Regurgitation Grade none   Aortic Valve Stenosis Grade none   Result Narrative   [facility] [place]  [id number]: 1970-12-25  CARDIAC DIAGNOSTIC UNIT Date: 10/19/2015 14:00:00  Adult Female Age: [age]  ECHO-DOPPLER REPORT Outpatient  Emergency Room ---------------------------------------------------- MD1: Orlena SheldonSepka, Richard Steph STUDY: Chest WallTAPE: 0000:00:0:00:00 BP: 141/74  ECHO: Yes DOPPLER: YesFILE: [id number]: HR: 96 COLOR: YesCONTRAST: NoMACHINE: [id number] #[id number]: 64 in RV BIOPSY: No 3D: No SOUND QLTY: ModerateWeight: 205 lbs  MEDIUM: NoneBSA: 2.05,BMI: 35.20 ------------------------------------------------------------------------------  HISTORY:Syncope REASON: Assess LV function INDICATION: R55 - Syncope and collapse. R56.9 - Unspecified convulsions (HCC).   ECHOCARDIOGRAPHIC MEASUREMENTS ----------------------------------------------- 2D DIMENSIONS AORTAValues Normal RangeMAIN PAValues Normal Range  Annulus:nm*cm[1.9 - 2.7]PA Main:nm*cm[1.5 - 2.1]  Aorta Sin: 3.0 cm[2.4 - 3.6] RIGHT VENTRICLE  ST  Junction:nm*cm[2 - 3.2]RV Base: 2.5 cm[2.5 - 4.1]  Asc.Aorta: 2.6 cm[1.9 - 3.5] RV Mid: 2.2 cm[1.9 - 3.5] LEFT VENTRICLE RV Length:nm*cm[]   LVIDd: 4.0 cm[3.7 - 5.3] RIGHT ATRIUM  LVIDs: 2.4 cm[2.2 - 3.4]RA Area:12 cm2 [ <= 20] LVEDVi:42.0 ml/m2 [29 - 61] RAVi:nm*ml/m2 [15 - 27] LVESVi:19.0 ml/m2 [8 - 24]INFERIOR VENA CAVA FS:40 % [ >= 25] Max.IVC:nm*cm[ <= 2.1]  SWT: 1.1 cm[0.6 - 0.9]Min.IVC:nm*cm[ <= 1.7]  PWT: 1.1 cm[0.6 - 0.9] __________________ LEFT ATRIUM nm* - not measured  LA Diam: 3.5 cm[2.7 - 3.8]  LA Area:16 cm2 [ <= 20]  LA Volume:42 ml[22 - 52] LAVi:20 ml/m2 [16 - 34]  ECHOCARDIOGRAPHIC DESCRIPTIONS ----------------------------------------------- AORTIC ROOT Size: Normal Dissection: INDETERM FOR DISSECTION  AORTIC VALVE Leaflets: Tricuspid Morphology: Normal Mobility: Fully Mobile  LEFT VENTRICLEAnterior: Normal Size: Normal Lateral: Normal  Contraction: NormalSeptal: Normal Closest EF: >55% (Estimated)Calc.EF: 55%Apical: Normal  LV masses: No Masses Inferior: Normal  LVH: MILD LVH CONCENTRICPosterior: Normal  LV GLS(AVG): -18.9% Normal Range [ <= -16] Dias.FxClass: RELAXATION ABNORMALITY (GRADE 1) CORRESPONDS TO E/A REVERSAL  MITRAL VALVE Leaflets: NormalMobility: Fully mobile Morphology: Normal  LEFT ATRIUM Size: Normal  LA masses: No masses Normal IAS  MAIN PA Size: Normal  PULMONIC VALVE Morphology:  Normal Mobility: Fully Mobile  RIGHT VENTRICLE Size: NormalFree wall: Normal  Contraction: NormalRV masses: No Masses  TAPSE: 1.8 cm,Normal Range [>= 1.6 cm]  TRICUSPID VALVE Leaflets: NormalMobility: Fully mobile Morphology: Normal  RIGHT ATRIUM Size: Normal RA Other: None  RA masses: No masses  PERICARDIUM  Fluid: No effusion  INFERIOR VENACAVA Size: Not Seen Not Seen  DOPPLER ECHO and OTHER SPECIAL PROCEDURES ------------------------------------  Aortic: No ARNo AS   Mitral: No MRNo MS  MV Inflow E Vel.= 86.0 cm/sMV Annulus E'Vel.= 10.0 cm/sE/E'Ratio= 9  Tricuspid: TRIVIAL TR No TS  Pulmonary: TRIVIAL PR No PS  Other:  INTERPRETATION --------------------------------------------------------------- NORMAL LEFT VENTRICULAR SYSTOLIC FUNCTION WITH MILD LVH NORMAL LA PRESSURES WITH DIASTOLIC DYSFUNCTION NORMAL RIGHT VENTRICULAR SYSTOLIC FUNCTION VALVULAR REGURGITATION: TRIVIAL PR, TRIVIAL TR NO VALVULAR STENOSIS NO PRIOR STUDY FOR COMPARISON   (Report version 3.0)Interpreted and Electronically signed  Perform. by: Rick Duffanny Rivera, RCS by: Pandora LeiterSreekanth Vemulapalli, M  Resp.Person: Rick DuffDanny Rivera, RCS On: 10/19/2015 17:02:16  Status Results Details

## 2017-12-31 NOTE — Pre-Procedure Instructions (Signed)
ECG 12-lead1/13/2017 [facility] Component Name Value Ref Range  Vent Rate (bpm) 83   PR Interval (msec) 122   QRS Interval (msec) 82   QT Interval (msec) 390   QTc (msec) 459   Result Narrative  Normal sinus rhythm Normal ECG  No previous ECGs available I reviewed and concur with this report. Electronically signed by:STIBER, MD, JONATHAN ([id number]) on 10/19/2015 12:03:21 PM  Status Results Details   Encounter Summary    

## 2017-12-31 NOTE — Patient Instructions (Signed)
Your procedure is scheduled on: 01-08-18 Report to Same Day Surgery 2nd floor medical mall [facility](Medical Mall Entrance-take elevator on left to 2nd floor.  Check in with surgery information desk.) To find out your arrival time please call [phone] between 1PM - 3PM on 01-07-18  Remember: Instructions that are not followed completely may result in serious medical risk, up to and including death, or upon the discretion of your surgeon and anesthesiologist your surgery may need to be rescheduled.    _x___ 1. Do not eat food after midnight the night before your procedure. NO GUM OR CANDY AFTER MIDNIGHT.  You may drink clear liquids up to 2 hours before you are scheduled to arrive at the hospital for your procedure.  Do not drink clear liquids within 2 hours of your scheduled arrival to the hospital.  Clear liquids include  --Water or Apple juice without pulp  --Clear carbohydrate beverage such as ClearFast or Gatorade  --Black Coffee or Clear Tea (No milk, no creamers, do not add anything to the coffee or Tea     __x__ 2. No Alcohol for 24 hours before or after surgery.   __x__3. No Smoking or e-cigarettes for 24 prior to surgery.  Do not use any chewable tobacco products for at least 6 hour prior to surgery   ____  4. Bring all medications with you on the day of surgery if instructed.    __x__ 5. Notify your doctor if there is any change in your medical condition     (cold, fever, infections).    x___6. On the morning of surgery brush your teeth with toothpaste and water.  You may rinse your mouth with mouth wash if you wish.  Do not swallow any toothpaste or mouthwash.   Do not wear jewelry, make-up, hairpins, clips or nail polish.  Do not wear lotions, powders, or perfumes. You may wear deodorant.  Do not shave 48 hours prior to surgery. Men may shave face and neck.  Do not bring valuables to the hospital.    [facility] is not responsible for any belongings or valuables.    Contacts, dentures or bridgework may not be worn into surgery.  Leave your suitcase in the car. After surgery it may be brought to your room.  For patients admitted to the hospital, discharge time is determined by your                       treatment team.  _  Patients discharged the day of surgery will not be allowed to drive home.  You will need someone to drive you home and stay with you the night of your procedure.      _x___ Take anti-hypertensive listed below, cardiac, seizure, asthma,     anti-reflux and psychiatric medicines. These include:  1. CRESTOR  2. WELLBUTRIN  3.  4.  5.  6.  ____Fleets enema or Magnesium Citrate as directed.   ____ Use CHG Soap or sage wipes as directed on instruction sheet   ____ Use inhalers on the day of surgery and bring to hospital day of surgery  ____ Stop Metformin and Janumet 2 days prior to surgery.    ____ Take 1/2 of usual insulin dose the night before surgery and none on the morning surgery.   _x___ Follow recommendations from Cardiologist, Pulmonologist or PCP regarding stopping Aspirin, Coumadin, Plavix ,Eliquis, Effient, or Pradaxa, and Pletal-OK TO CONTINUE 81 MG ASA-DO NOT TAKE AM OF SURGERY  ____Stop Anti-inflammatories such as Advil, Aleve, Ibuprofen, Motrin, Naproxen, Naprosyn, Goodies powders or aspirin products. OK to take Tylenol    ____ Stop supplements until after surgery.     ____ Bring C-Pap to the hospital.

## 2017-12-31 NOTE — Pre-Procedure Instructions (Signed)
Progress Notes - in this encounter  Table of Contents for Progress Notes  Gerald Stabs, CMA - 06/04/2017 9:15 AM EDT  Chilukuri, Marcheta Grammes, MD - 06/04/2017 9:15 AM EDT    Gerald Stabs, CMA - 06/04/2017 9:15 AM EDT Patient arrived in clinic today for appointment with scheduled provider. Patient was called back to exam room for initial intake from nursing staff.   Nursing staff assessed height and weight and obtained accurate vital signs.   In active dialogue with patient reviewed chief complaint, completed a falls risk assessment for safety needs, reviewed allergies, did a complete medication reconciliation, reviewed pharmacy including location and contact numbers for further follow up. The patient's pain level was assessed and evaluated and appropriate education provided regarding the pain scale. All questions and concerns addressed at this time regarding the patient's visit thus far. Refill needs and personal or clinic paperwork was discussed. Provided patient with their appropriate paperwork per providers request.     Back to top of Progress Notes Chilukuri, Marcheta Grammes, MD - 06/04/2017 9:15 AM EDT Formatting of this note may be different from the original. [facility]  Mckenzie Roberts is here today for a recheck of prior left frontal stroke noted incidentally while she was being worked up for syncopal episode in the ED. Her only known stroke risk factors include obesity and hyperlipidemia. Her workup included a CTA of the head and neck that did not show any hemodynamically significant intra-extracranial stenosis. Her left ICA and MCA were small relative to the right. Transthoracic echocardiogram was negative for intracardiac thrombus and 30 day event monitor did not show atrial fibrillation. She is on 81 mg of aspirin daily. Her husband accompanies her to clinic today. She is due to have her right knee replaced soon. She apparently was taking some over-the-counter weight loss product  called Friday for which resulted in some elevation of her creatinine. She has since stopped the medication and it has normalized. She apparently stopped taking Crestor at that time as well. Since her last visit to see me there have been no symptoms concerning for a recurrent stroke or TIA. She denies diplopia, visual field cut, amaurosis, dysarthria, aphasia, trouble swallowing, focal weakness, numbness or discoordination. There have been no falls , bowel or bladder issues or problems with mood or memory. She remains completely independent all her activities of daily living, drives and handles her financial affairs.  Diagnostic studies CTA head and neck 11/02/15 1.Intracranial and extracranial carotid and vertebral arterial vasculature are patent. 2.The left cervical ICA is relatively small and measures 3mm in diameter.The left MCA is also small. No focal stenosis is visualized. Decreased perfusion in the left frontal region, consistent with prior known infarct.    Diagnostic studies CT brain without contrast 10/19/15 1. Focal area of hypodensity involving the middle frontal gyrus of uncertain etiology with punctate hyperdensities in this region concerningfor tiny microhemorrhages versus mineralization. Recommend correlation withbrain MRI. 2. Diffuse left cerebral volume loss.  MRI brain with and without contrast 10/19/15 1. Evidence of chronic infarct and encephalomalacia identified in the leftfrontal lobe.  2. Left carotid flow void is asymmetrically small with diffuse mild volume loss in the left hemisphere. These findings suggest presence of a leftproximal carotid stenosis. Vascular imaging of the neck is recommended. 3. No acute intracranial process.   Other diagnostic studies  Transthoracic echocardiogram 10/19/15 NORMAL LEFT VENTRICULAR SYSTOLIC FUNCTION WITH MILD LVH NORMAL LA PRESSURES WITH DIASTOLIC DYSFUNCTION NORMAL RIGHT VENTRICULAR SYSTOLIC FUNCTION VALVULAR  REGURGITATION: TRIVIAL  PR, TRIVIAL TR NO VALVULAR STENOSIS NO PRIOR STUDY FOR COMPARISON  30 day event monitor 12/03/15 No A. Fib  LDL 101 (01/03/09) Hemoglobin A1c 5.4 (01/17/09)  Allergies  Allergen Reactions  . Niacin Syncope  Flushing- Syncope reaction after taking dose increased to 1000 mg 3 x day.  . Sulfa (Sulfonamide Antibiotics) Other (See Comments)  As a child, does not remember    Medication Sig  . acyclovir (ZOVIRAX) 5 % ointment acyclovir 5 % topical ointment  . aspirin 81 MG EC tablet Take 81 mg by mouth once daily.  . buproprion (WELLBUTRIN XL) 150 MG XL tablet 1 tab by mouth daily  . amoxicillin-clavulanate (AUGMENTIN) 875-125 mg tablet amoxicillin 875 mg-potassium clavulanate 125 mg tablet  . aspirin 325 MG tablet aspirin 325 mg tablet Take 1 tab by mouth every 12 hours starting post op day 1 for 6 weeks  . cefdinir (OMNICEF) 300 mg capsule cefdinir 300 mg capsule  . clindamycin (CLEOCIN) 2 % vaginal cream clindamycin 2 % vaginal cream  . fluconazole (DIFLUCAN) 150 MG tablet fluconazole 150 mg tablet  . hydrocodone bit/homatrop me-br (HYDROCODONE COMPOUND ORAL) hydrocodone 5 mg-acetaminophen 500 mg tablet  . loratadine (CLARITIN) 5 mg chewable tablet Take 5 mg by mouth.  . metronidazole (METROGEL) 0.75 % vaginal gel metronidazole 0.75 % vaginal gel  . phentermine (ADIPEX-P) 37.5 mg tablet Adipex-P 37.5 mg tablet  . rosuvastatin (CRESTOR) 5 MG tablet rosuvastatin 5 mg tablet  . tramadol (ULTRAM) 50 mg tablet tramadol 50 mg tablet  . ZOVIRAX 5 % cream APPLY AA FID   No current facility-administered medications for this visit.    Personal History  Social History   Social History  . Marital status: Married  Spouse name: Fayrene Fearing  . Number of children: 1  . Years of education: 37   Occupational History  . Data team lead   Social History Main Topics  . Smoking status: Never Smoker  . Smokeless tobacco: Never Used  . Alcohol use 1.0 oz/week  2  Standard drinks or equivalent per week  . Drug use: No  . Sexual activity: Yes  Partners: Male   Other Topics Concern  . 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 She does not smoke or use any recreational drugs. She drinks 2-3 alcoholic beverages a week. She is married and has one daughter. [place]   Family History -  Family History  Problem Relation Age of Onset  . High blood pressure (Hypertension) Mother  . Diabetes Mother  . High blood pressure (Hypertension) Father  . Diabetes Father  . Bipolar disorder Sister  . Bipolar disorder Daughter   Past Medical History  Past Medical History:  Diagnosis Date  . Depression, unspecified  . Hyperlipidemia, unspecified, unspecified   Past Surgical History Past Surgical History:  Procedure Laterality Date  . Right knee arthroscopy, medial and lateral meniscectomies, and microfracture 05/2015  Dr [facility]  . Right knee medial unicompartmental arthroplasty 02/07/2016  Dr Zenon Mayo [facility]   Review of Systems:  Constitutional: No problems.  Visual: No problems.  ENT: No problems.  Hematologic/Lymphatic: No problems.  Cardio: No problems.  Respiratory: No problems.  Allergy/Immunology: No problems.  GI: No problems.  GU: No problems.  Dermatologic: No problems.  Endocrine: No problems.  Muscles/Joints/Bones: No problems.  Neuro: As in HPI.  Psychiatric: No  problems.   Post-Stroke Depression Screening  PHQ9:  Total Score =: 2  Depression Severity and Treatment Recommendations: 0-4=None  Vitals:  06/04/17 0924  BP: 125/82  BP Location: Left upper arm  Patient Position: Sitting  BP Cuff Size: Large Adult  Pulse: 93  Temp: 36.1 C (97 F)  TempSrc: Oral  Weight: (!) 102.9 kg (226 lb 13.7 oz)  Height: 162.6 cm (5\' 4" )    General Exam Sheis alert and in  NAD. HEENT exam reveals clear external auditory canals and tympanic membranes. Oropharynx is without inflammation. No cervical lymphadenopathy or thyromegaly noted., No carotid bruits appreciated. Lungs clear, Heart - NSR without murmurs or gallops. Extremities without edema, cyanosis or clubbing. Well healing scar noted on the right knee. Peripheral pulses are palpated.   Neurological Examination:   Mental Status- Sheis alert and oriented. Her speech is clear and fluent. She could name, repeat and follow commands.   Cranial Nerve Examination: Pupils were 3 constricting to 2 and 2 to direct and consensual stimulation bilaterally. Fundoscopic exam revealed no papilledema. Visual fields were full to confrontation. Visual acuity was not tested. Extraocular movements were intact. No nystagmus noted. Facial sensation was intact to light touch in the V1 through V3 distributions bilaterally. Temporalis and masseters were full strength. No facial asymmetry was noted and mimetics were symmetric. Hearing was intact to finger rub bilaterally Tongue was midline. Palate elevated symmetrically. Sternocleidomastoid and trapezius were full strength.   Motor Examination: 5/5 strength in all 4 extremities with normal tone and bulk, no abnormal movements and no drift. AMR and arm roll were symmetric.   Sensory Examination: Intact to light touch, pin prick, temperature perception, joint position, vibration sense in all four extremities. There was no neglect. Graphesthesia was normal.   Coordination: Intact to finger-to-nose, heel-to-shin and rapid, alternating movements.   Reflexes: Reflexes were 1s at the biceps, triceps, brachioradialis, patellar and at the ankles. Plantars were flexor.   Rombergs: Absent.   Station and Gait: Normal. Shecould tandem, heel and toe walk.   Impression and Plan -Mckenzie Roberts is a 47 y.o. Rthanded, white femaleseen for a chronic left frontal stroke  noted incidentally while she was being worked up in the ED for an apparent syncopal episode. Her only known stroke risk factors are hyperlipidemia and obesity. Her neurological exam is currently normal and she has had no recurrent syncopal episodes or other focal neurological symptoms. CTA of the head and neck did not show any hemodynamically significant stenosis. Her left ICA and MCA were small relative to the right. Echocardiogram was negative for intracardiac thrombus. 30 day event monitor was negative for atrial fibrillation. It was felt that her syncopal episode was likely related to hypotension from vasodilatation after taking an increased dose of niacin. She will stay on 81 mg of aspirin daily and continue with risk factor management. She is asked to hold the aspirin perioperatively for the shortest time deemed safe by her physician. They are also to avoid perioperative hypo-/hypertension. She is to consider resuming low-dose statin. I have gone over the pathophysiology of stroke, warning signs and symptoms, risk factors and their management in some detail with instructions to go to the closest emergency room for symptoms of concern. Target LDL should be less than 70, systolic blood pressure less than 140 and hemoglobin A1c less than 7.0. The benefits of 30 to 45 minutes of moderate exercise 5 times weekly and the Mediterranean diet are discussed as well. Follow-up in 1 year and p.r.n.  This note was generated in part with voice recognition software and I apologize for any typographical errors that were not detected and corrected.  I personally performed the service. (TP)  Jay SchlichterVani Chilukuri, M.D. 06/04/2017 9:44 AM  Back to top of Progress Notes   Plan of Treatment - as of this encounter  Upcoming Encounters Upcoming Encounters  Date Type Specialty Care Team Description  06/16/2017 Office Visit Orthopaedics Latanya MaudlinWolfe, Jon Richard, PA  [address]  [facility]    [place], [place] [address]  [phone]  [phone] (Fax)    07/07/2017 Post Op Orthopaedics Latanya MaudlinWolfe, Jon Richard, PA  [address]  [facility]  [place], [place] [address]  [phone]  [phone] (Fax)    07/07/2017 PT/OT Office Visit Physical Therapy Elta Guadeloupeaulakonis, Matthew D, PT  [address]  [place], [place] [address]  [phone]  [phone] (Fax)    08/04/2017 Post Op Orthopaedics Hooten, Adelina MingsJames Philmon Jr., MD  [address]  [facility]  [place], [place] [address]  [phone]  [phone] (Fax)    06/11/2018 Office Visit Neurology Chilukuri, Marcheta GrammesVani Rao, MD  [address] [facility]  [place], [place] [address]  [phone]  [phone] (Fax)     Visit Diagnoses   Diagnosis  Chronic arterial ischemic stroke - Primary  Transient ischemic attack (TIA), and cerebral infarction without residual deficits    Historical Medications - added in this encounter  This list may reflect changes made after this encounter.  Medication Sig. Disp. Refills Start Date End Date  hydrocodone bit/homatrop me-br (HYDROCODONE COMPOUND ORAL)  hydrocodone 5 mg-acetaminophen 500 mg tablet      traMADol (ULTRAM) 50 mg tablet  tramadol 50 mg tablet      metroNIDAZOLE (METROGEL) 0.75 % vaginal gel  metronidazole 0.75 % vaginal gel      rosuvastatin (CRESTOR) 5 MG tablet  rosuvastatin 5 mg tablet      fluconazole (DIFLUCAN) 150 MG tablet  fluconazole 150 mg tablet      cefdinir (OMNICEF) 300 mg capsule  cefdinir 300 mg capsule      clindamycin (CLEOCIN) 2 % vaginal cream  clindamycin 2 % vaginal cream      aspirin 325 MG tablet  aspirin 325 mg tablet Take 1 tab by mouth every 12 hours starting post op day 1 for 6 weeks      amoxicillin-clavulanate (AUGMENTIN) 875-125 mg tablet  amoxicillin 875 mg-potassium clavulanate 125 mg tablet      phentermine (ADIPEX-P) 37.5 mg tablet   Adipex-P 37.5 mg tablet      ZOVIRAX 5 % cream  APPLY AA FID  4 05/12/2017   acyclovir (ZOVIRAX) 5 % ointment  acyclovir 5 % topical ointment       Images Patient Contacts   Contact Name Contact Address Communication Relationship to Patient  Larry SierrasCindy Hurless [address] [place], [place] [address] [phone] (Mobile) [phone] (Home) [email or web address] Spouse, Emergency Contact   Document Information  Primary Care Provider Other Service Providers Document Coverage Dates  Lajean Saverlaina Lynn Lee MD (May 15, 2018May 15, 2018 - Present) [phone] (Work) [phone] (Fax) [address] [facility] [place], [place] [address]   Aug. 30, 2018August 30, 2018   Custodian Organization  [facility] [place], [place] [address]   Encounter Providers Encounter Date  Clista BernhardtVani Rao Chilukuri MD (Attending) [phone] (Work) [phone] (Fax) [address] [facility] [address] [place], [place] [address]  Aug. 30, 2018August 30, 2018

## 2018-01-07 NOTE — Pre-Procedure Instructions (Signed)
ECG 12-[facility]1/13/2017 [facility] Component Name Value Ref Range  Vent Rate (bpm) 83   PR Interval (msec) 122   QRS Interval (msec) 82   QT Interval (msec) 390   QTc (msec) 459   Result Narrative  Normal sinus rhythm Normal ECG  No previous ECGs available I reviewed and concur with this report. Electronically signed ZO:[email or web address]:STIBER, MD, Christiane HaJONATHAN [phone]) on 10/19/2015 12:03:21 PM  Status Results Details   Encounter Summary

## 2018-01-08 ENCOUNTER — Ambulatory Visit: Payer: No Typology Code available for payment source | Admitting: Anesthesiology

## 2018-01-08 ENCOUNTER — Other Ambulatory Visit: Payer: Self-pay

## 2018-01-08 ENCOUNTER — Encounter
Admission: RE | Disposition: A | Discharge status: Home / Self Care | Payer: Self-pay | Source: Ambulatory Visit | Attending: General Surgery

## 2018-01-08 ENCOUNTER — Ambulatory Visit
Admission: RE | Admit: 2018-01-08 | Discharge: 2018-01-08 | Disposition: A | Discharge status: Home / Self Care | Payer: No Typology Code available for payment source | Source: Ambulatory Visit | Attending: General Surgery | Admitting: General Surgery

## 2018-01-08 ENCOUNTER — Ambulatory Visit: Payer: No Typology Code available for payment source

## 2018-01-08 DIAGNOSIS — Z8673 Personal history of transient ischemic attack (TIA), and cerebral infarction without residual deficits: Secondary | ICD-10-CM | POA: Diagnosis not present

## 2018-01-08 DIAGNOSIS — Z419 Encounter for procedure for purposes other than remedying health state, unspecified: Secondary | ICD-10-CM

## 2018-01-08 DIAGNOSIS — K811 Chronic cholecystitis: Secondary | ICD-10-CM | POA: Diagnosis not present

## 2018-01-08 DIAGNOSIS — Z7982 Long term (current) use of aspirin: Secondary | ICD-10-CM | POA: Insufficient documentation

## 2018-01-08 DIAGNOSIS — K8020 Calculus of gallbladder without cholecystitis without obstruction: Secondary | ICD-10-CM | POA: Diagnosis not present

## 2018-01-08 DIAGNOSIS — E785 Hyperlipidemia, unspecified: Secondary | ICD-10-CM | POA: Insufficient documentation

## 2018-01-08 DIAGNOSIS — K8010 Calculus of gallbladder with chronic cholecystitis without obstruction: Secondary | ICD-10-CM | POA: Diagnosis present

## 2018-01-08 DIAGNOSIS — Z79899 Other long term (current) drug therapy: Secondary | ICD-10-CM | POA: Diagnosis not present

## 2018-01-08 DIAGNOSIS — F329 Major depressive disorder, single episode, unspecified: Secondary | ICD-10-CM | POA: Diagnosis not present

## 2018-01-08 HISTORY — PX: CHOLECYSTECTOMY: SHX55

## 2018-01-08 LAB — POCT PREGNANCY, URINE: Preg Test, Ur: NEGATIVE

## 2018-01-08 SURGERY — LAPAROSCOPIC CHOLECYSTECTOMY WITH INTRAOPERATIVE CHOLANGIOGRAM
Anesthesia: General | Wound class: Clean Contaminated

## 2018-01-08 MED ORDER — PROPOFOL 10 MG/ML IV BOLUS
INTRAVENOUS | Status: AC
  Filled 2018-01-08: qty 20

## 2018-01-08 MED ORDER — ONDANSETRON HCL 4 MG/2ML IJ SOLN
INTRAMUSCULAR | Status: DC | PRN
  Administered 2018-01-08: 4 mg via INTRAVENOUS

## 2018-01-08 MED ORDER — ROCURONIUM BROMIDE 100 MG/10ML IV SOLN
INTRAVENOUS | Status: DC | PRN
  Administered 2018-01-08: 50 mg via INTRAVENOUS

## 2018-01-08 MED ORDER — FAMOTIDINE 20 MG PO TABS
ORAL_TABLET | ORAL | Status: AC
  Administered 2018-01-08: 20 mg via ORAL
  Filled 2018-01-08: qty 1

## 2018-01-08 MED ORDER — ACETAMINOPHEN 10 MG/ML IV SOLN
INTRAVENOUS | Status: DC | PRN
  Administered 2018-01-08: 1000 mg via INTRAVENOUS

## 2018-01-08 MED ORDER — FENTANYL CITRATE (PF) 100 MCG/2ML IJ SOLN
INTRAMUSCULAR | Status: DC | PRN
  Administered 2018-01-08 (×2): 50 ug via INTRAVENOUS

## 2018-01-08 MED ORDER — FENTANYL CITRATE (PF) 100 MCG/2ML IJ SOLN: 25.0000 ug | INTRAMUSCULAR | Status: DC | PRN

## 2018-01-08 MED ORDER — LIDOCAINE HCL (CARDIAC) 20 MG/ML IV SOLN
INTRAVENOUS | Status: DC | PRN
  Administered 2018-01-08: 100 mg via INTRAVENOUS

## 2018-01-08 MED ORDER — PHENYLEPHRINE HCL 10 MG/ML IJ SOLN
INTRAMUSCULAR | Status: AC
Start: 2018-01-08 — End: ?
  Filled 2018-01-08: qty 1

## 2018-01-08 MED ORDER — SODIUM CHLORIDE 0.9 % IJ SOLN
INTRAMUSCULAR | Status: AC
  Filled 2018-01-08: qty 50

## 2018-01-08 MED ORDER — KETOROLAC TROMETHAMINE 30 MG/ML IJ SOLN
INTRAMUSCULAR | Status: DC | PRN
  Administered 2018-01-08: 30 mg via INTRAVENOUS

## 2018-01-08 MED ORDER — FAMOTIDINE 20 MG PO TABS
20.0000 mg | ORAL_TABLET | Freq: Once | ORAL | Status: AC
  Administered 2018-01-08: 20 mg via ORAL

## 2018-01-08 MED ORDER — SUGAMMADEX SODIUM 500 MG/5ML IV SOLN
INTRAVENOUS | Status: DC | PRN
Start: 2018-01-08 — End: 2018-01-08
  Administered 2018-01-08: 200 mg via INTRAVENOUS

## 2018-01-08 MED ORDER — ONDANSETRON HCL 4 MG/2ML IJ SOLN
INTRAMUSCULAR | Status: AC
  Filled 2018-01-08: qty 2

## 2018-01-08 MED ORDER — OXYCODONE HCL 5 MG/5ML PO SOLN: 5.0000 mg | Freq: Once | ORAL | Status: AC | PRN

## 2018-01-08 MED ORDER — LACTATED RINGERS IV SOLN
INTRAVENOUS | Status: DC
  Administered 2018-01-08 (×2): via INTRAVENOUS

## 2018-01-08 MED ORDER — PROPOFOL 10 MG/ML IV BOLUS
INTRAVENOUS | Status: DC | PRN
  Administered 2018-01-08: 170 mg via INTRAVENOUS

## 2018-01-08 MED ORDER — LACTATED RINGERS IV SOLN
INTRAVENOUS | Status: DC | PRN
  Administered 2018-01-08: 09:00:00 via INTRAVENOUS

## 2018-01-08 MED ORDER — PROMETHAZINE HCL 25 MG/ML IJ SOLN: 6.2500 mg | INTRAMUSCULAR | Status: DC | PRN

## 2018-01-08 MED ORDER — HYDROCODONE-ACETAMINOPHEN 5-325 MG PO TABS: 1.0000 | ORAL_TABLET | ORAL | 0 refills | Status: AC | PRN

## 2018-01-08 MED ORDER — ACETAMINOPHEN 10 MG/ML IV SOLN
INTRAVENOUS | Status: AC
  Filled 2018-01-08: qty 100

## 2018-01-08 MED ORDER — OXYCODONE HCL 5 MG PO TABS
ORAL_TABLET | ORAL | Status: AC
  Filled 2018-01-08: qty 1

## 2018-01-08 MED ORDER — FENTANYL CITRATE (PF) 250 MCG/5ML IJ SOLN
INTRAMUSCULAR | Status: AC
  Filled 2018-01-08: qty 5

## 2018-01-08 MED ORDER — DEXAMETHASONE SODIUM PHOSPHATE 10 MG/ML IJ SOLN
INTRAMUSCULAR | Status: DC | PRN
  Administered 2018-01-08: 5 mg via INTRAVENOUS

## 2018-01-08 MED ORDER — MIDAZOLAM HCL 2 MG/2ML IJ SOLN
INTRAMUSCULAR | Status: DC | PRN
  Administered 2018-01-08: 2 mg via INTRAVENOUS

## 2018-01-08 MED ORDER — MIDAZOLAM HCL 2 MG/2ML IJ SOLN
INTRAMUSCULAR | Status: AC
  Filled 2018-01-08: qty 2

## 2018-01-08 MED ORDER — MEPERIDINE HCL 50 MG/ML IJ SOLN: 6.2500 mg | INTRAMUSCULAR | Status: DC | PRN

## 2018-01-08 MED ORDER — DEXAMETHASONE SODIUM PHOSPHATE 10 MG/ML IJ SOLN
INTRAMUSCULAR | Status: AC
  Filled 2018-01-08: qty 1

## 2018-01-08 MED ORDER — OXYCODONE HCL 5 MG PO TABS
5.0000 mg | ORAL_TABLET | Freq: Once | ORAL | Status: AC | PRN
  Administered 2018-01-08: 5 mg via ORAL

## 2018-01-08 SURGICAL SUPPLY — 40 items
APPLIER CLIP ROT 10 11.4 M/L (STAPLE) ×3
BLADE SURG 11 STRL SS SAFETY (MISCELLANEOUS) ×3 IMPLANT
CANISTER SUCT 1200ML W/VALVE (MISCELLANEOUS) ×3 IMPLANT
CANNULA DILATOR  5MM W/SLV (CANNULA) ×2
CANNULA DILATOR 10 W/SLV (CANNULA) ×2 IMPLANT
CANNULA DILATOR 10MM W/SLV (CANNULA) ×1
CANNULA DILATOR 5 W/SLV (CANNULA) ×4 IMPLANT
CATH CHOLANG 76X19 KUMAR (CATHETERS) ×3 IMPLANT
CHLORAPREP W/TINT 26ML (MISCELLANEOUS) ×3 IMPLANT
CLIP APPLIE ROT 10 11.4 M/L (STAPLE) ×1 IMPLANT
CLOSURE WOUND 1/2 X4 (GAUZE/BANDAGES/DRESSINGS) ×1
CONRAY 60ML FOR OR (MISCELLANEOUS) ×3 IMPLANT
DISSECTOR KITTNER STICK (MISCELLANEOUS) ×1 IMPLANT
DISSECTORS/KITTNER STICK (MISCELLANEOUS) ×3
DRAPE SHEET LG 3/4 BI-LAMINATE (DRAPES) ×3 IMPLANT
DRSG TEGADERM 2-3/8X2-3/4 SM (GAUZE/BANDAGES/DRESSINGS) ×12 IMPLANT
DRSG TELFA 4X3 1S NADH ST (GAUZE/BANDAGES/DRESSINGS) ×3 IMPLANT
ELECT REM PT RETURN 9FT ADLT (ELECTROSURGICAL) ×3
ELECTRODE REM PT RTRN 9FT ADLT (ELECTROSURGICAL) ×1 IMPLANT
GLOVE BIO SURGEON STRL SZ7.5 (GLOVE) ×3 IMPLANT
GLOVE INDICATOR 8.0 STRL GRN (GLOVE) ×3 IMPLANT
GOWN STRL REUS W/ TWL LRG LVL3 (GOWN DISPOSABLE) ×3 IMPLANT
GOWN STRL REUS W/TWL LRG LVL3 (GOWN DISPOSABLE) ×6
IRRIGATION STRYKERFLOW (MISCELLANEOUS) ×1 IMPLANT
IRRIGATOR STRYKERFLOW (MISCELLANEOUS) ×3
IV LACTATED RINGERS 1000ML (IV SOLUTION) ×3 IMPLANT
KIT TURNOVER KIT A (KITS) ×3 IMPLANT
LABEL OR SOLS (LABEL) ×3 IMPLANT
NDL INSUFF ACCESS 14 VERSASTEP (NEEDLE) ×3 IMPLANT
NS IRRIG 500ML POUR BTL (IV SOLUTION) ×3 IMPLANT
PACK LAP CHOLECYSTECTOMY (MISCELLANEOUS) ×3 IMPLANT
POUCH SPECIMEN RETRIEVAL 10MM (ENDOMECHANICALS) ×3 IMPLANT
SCISSORS METZENBAUM CVD 33 (INSTRUMENTS) ×3 IMPLANT
STRIP CLOSURE SKIN 1/2X4 (GAUZE/BANDAGES/DRESSINGS) ×2 IMPLANT
SUT VIC AB 0 CT2 27 (SUTURE) ×3 IMPLANT
SUT VIC AB 4-0 FS2 27 (SUTURE) ×3 IMPLANT
SWABSTK COMLB BENZOIN TINCTURE (MISCELLANEOUS) ×3 IMPLANT
TROCAR XCEL NON-BLD 11X100MML (ENDOMECHANICALS) ×3 IMPLANT
TUBING INSUFFLATION (TUBING) ×3 IMPLANT
WATER STERILE IRR 1000ML POUR (IV SOLUTION) ×3 IMPLANT

## 2018-01-08 NOTE — Anesthesia Postprocedure Evaluation (Signed)
Anesthesia Post Note  Patient: Larry SierrasCindy Mcalexander  Procedure(s) Performed: LAPAROSCOPIC CHOLECYSTECTOMY WITH INTRAOPERATIVE CHOLANGIOGRAM (N/A )  Patient location during evaluation: PACU Anesthesia Type: General Level of consciousness: awake and alert and oriented Pain management: pain level controlled Vital Signs Assessment: post-procedure vital signs reviewed and stable Respiratory status: spontaneous breathing, nonlabored ventilation and respiratory function stable Cardiovascular status: blood pressure returned to baseline and stable Postop Assessment: no signs of nausea or vomiting Anesthetic complications: no     Last Vitals:  Vitals:   01/08/18 1050 01/08/18 1107  BP: 129/82 120/78  Pulse: 90 88  Resp: 16   Temp: 36.7 C   SpO2: 97% 98%    Last Pain:  Vitals:   01/08/18 1050  TempSrc:   PainSc: 1                  Nicklaus Alviar

## 2018-01-08 NOTE — OR Nursing (Signed)
Discharge instructions discussed with family and pt. Both voice understanding. Dr Lemar LivingsByrnett by to see pt.

## 2018-01-08 NOTE — Discharge Instructions (Signed)

## 2018-01-08 NOTE — Anesthesia Post-op Follow-up Note (Signed)
Anesthesia QCDR form completed.        

## 2018-01-08 NOTE — Op Note (Addendum)
Preoperative diagnosis: Chronic cholecystitis and cholelithiasis.  Postoperative diagnosis: Chronic cholecystitis.  Operative procedure: Laparoscopic cholecystectomy with intraoperative cholangiograms.  Operating Surgeon: Donnalee CurryJeffrey Kienna Moncada, MD.  Anesthesia: General endotracheal.  Estimated blood loss: Less than 5 cc.  Clinical note: This 47 year old woman had episodic abdominal pain and ultrasound an outside facility demonstrated a single gallstone in the gallbladder.  No evidence gallbladder wall thickening.  She is felt to be a candidate for elective cholecystectomy.  Operative note: With the patient under adequate general endotracheal anesthesia the abdomen was cleansed with ChloraPrep and draped.  In Trendelenburg position a varies needle was placed through a trans-umbilical incision.  After assuring intra-abdominal location with a hanging drop test the abdomen was insufflated CO2 to 10 millimeters Hg pressure she was placed into reverse Trendelenburg position and rolled to the left.  An orogastric tube was placed by the nurse anesthetist as there was moderate distention of the antrum of the stomach.  The liver showed evidence of fatty infiltration.  An 11 mm XL port was placed in the epigastrium and 2-5 mm Step ports placed in the right lower quadrant. The gallbladder showed minimal inflammation on its fundus but scarring at the neck of the gallbladder.  It was decompressed to provide better exposure with a needle catheter.  The exposure was still lacking at the very neck of the gallbladder due to her adipose tissue and a 5th port was placed in the right anterior axillary line so that a Peer retractor could be placed for medial retraction.  The neck of the gallbladder was cleared and the cystic duct exposed.  An enlarged cystic lymph node node was identified and left undisturbed.  Fluoroscopic cholangiograms were completed using 35 cc of one half strength Conray 60.  There was some reflection of  the gallbladder and then filling of the cystic duct followed by the common bile duct and reflux into the common hepatic duct.  With final injection both the right and left hepatic ducts were exposed and there was noted to be free flow into the duodenum.  The duct was significantly larger than reported on her ultrasound when it was at 3.5 mm.  No evidence of retained stones.  The cystic duct was doubly clipped and divided.  The cystic artery was doubly clipped in the same fashion.  The gallbladder was then removed from the liver bed making use of hook cautery.  There was a small bleeding point in the base of the gallbladder wound and this was controlled with application of a piece of Surgicel.  The gallbladder was dissected free and then extracted in an Endo Catch bag.  The open gallbladder specimen showed no stones but evidence of chronic cholecystitis.  The right upper quadrant was irrigated with lactated Ringer solution.  Good hemostasis was noted.  The abdomen was then desufflated under direct vision while the ports were removed.  The fascia at the umbilicus was closed with a single 0 Vicryl figure-of-eight suture.  All skin incisions were closed with 4-0 Vicryl subcuticular sutures.  Benzoin, Steri-Strips, Telfa and Tegaderm dressings were applied.  The patient tolerated the procedure well and was taken recovery room in stable condition.

## 2018-01-08 NOTE — Transfer of Care (Signed)
Immediate Anesthesia Transfer of Care Note  Patient: Mckenzie Roberts  Procedure(s) Performed: LAPAROSCOPIC CHOLECYSTECTOMY WITH INTRAOPERATIVE CHOLANGIOGRAM (N/A )  Patient Location: PACU  Anesthesia Type:General  Level of Consciousness: sedated  Airway & Oxygen Therapy: Patient Spontanous Breathing and Patient connected to face mask oxygen  Post-op Assessment: Report given to RN and Post -op Vital signs reviewed and stable  Post vital signs: Reviewed and stable  Last Vitals:  Vitals Value Taken Time  BP 119/80 01/08/2018 10:13 AM  Temp    Pulse 96 01/08/2018 10:16 AM  Resp 22 01/08/2018 10:16 AM  SpO2 93 % 01/08/2018 10:16 AM  Vitals shown include unvalidated device data.  Last Pain:  Vitals:   01/08/18 0818  TempSrc: Oral  PainSc: 0-No pain         Complications: No apparent anesthesia complications

## 2018-01-08 NOTE — Anesthesia Procedure Notes (Signed)
Procedure Name: Intubation Date/Time: 01/08/2018 8:54 AM Performed by: Justus Memory, CRNA Pre-anesthesia Checklist: Patient identified, Patient being monitored, Timeout performed, Emergency Drugs available and Suction available Patient Re-evaluated:Patient Re-evaluated prior to induction Oxygen Delivery Method: Circle system utilized Preoxygenation: Pre-oxygenation with 100% oxygen Induction Type: IV induction Ventilation: Mask ventilation without difficulty Laryngoscope Size: Mac and 3 Grade View: Grade I Tube type: Oral Tube size: 7.0 mm Number of attempts: 1 Airway Equipment and Method: Stylet Placement Confirmation: ETT inserted through vocal cords under direct vision,  positive ETCO2 and breath sounds checked- equal and bilateral Secured at: 21 cm Tube secured with: Tape Dental Injury: Teeth and Oropharynx as per pre-operative assessment

## 2018-01-08 NOTE — Anesthesia Preprocedure Evaluation (Signed)
Anesthesia Evaluation  Patient identified by MRN, date of birth, ID band Patient awake    Reviewed: Allergy & Precautions, NPO status , Patient's Chart, lab work & pertinent test results  History of Anesthesia Complications Negative for: history of anesthetic complications  Airway Mallampati: III  TM Distance: >3 FB Neck ROM: Full    Dental no notable dental hx.    Pulmonary neg pulmonary ROS, neg sleep apnea, neg COPD,    breath sounds clear to auscultation- rhonchi (-) wheezing      Cardiovascular Exercise Tolerance: Good (-) hypertension(-) CAD, (-) Past MI, (-) Cardiac Stents and (-) CABG  Rhythm:Regular Rate:Normal - Systolic murmurs and - Diastolic murmurs    Neuro/Psych PSYCHIATRIC DISORDERS Depression TIA   GI/Hepatic Neg liver ROS, GERD  ,  Endo/Other  negative endocrine ROSneg diabetes  Renal/GU negative Renal ROS     Musculoskeletal negative musculoskeletal ROS (+)   Abdominal (+) + obese,   Peds  Hematology negative hematology ROS (+)   Anesthesia Other Findings Past Medical History: No date: Depression No date: Family history of adverse reaction to anesthesia     Comment:  MOM-N/V No date: GERD (gastroesophageal reflux disease)     Comment:  OCC No date: Hyperlipemia 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HCC) 10/2015: Stroke (HCC)     Comment:  TIA   Reproductive/Obstetrics                             Anesthesia Physical Anesthesia Plan  ASA: II  Anesthesia Plan: General   Post-op Pain Management:    Induction: Intravenous  PONV Risk Score and Plan: 2 and Ondansetron, Dexamethasone and Midazolam  Airway Management Planned: Oral ETT  Additional Equipment:   Intra-op Plan:   Post-operative Plan: Extubation in OR  Informed Consent: I have reviewed the patients History and Physical, chart, labs and discussed the procedure including the risks, benefits and alternatives  for the proposed anesthesia with the patient or authorized representative who has indicated his/her understanding and acceptance.   Dental advisory given  Plan Discussed with: CRNA and Anesthesiologist  Anesthesia Plan Comments:         Anesthesia Quick Evaluation

## 2018-01-08 NOTE — H&P (Signed)
No change in clinical history or exam. For cholecystectomy.  

## 2018-01-11 LAB — SURGICAL PATHOLOGY

## 2018-01-19 ENCOUNTER — Ambulatory Visit (INDEPENDENT_AMBULATORY_CARE_PROVIDER_SITE_OTHER): Payer: 59 | Admitting: General Surgery

## 2018-01-19 ENCOUNTER — Encounter: Payer: Self-pay | Admitting: General Surgery

## 2018-01-19 VITALS — BP 130/78 | HR 76 | Resp 14 | Ht 62.0 in | Wt 237.0 lb

## 2018-01-19 DIAGNOSIS — K8020 Calculus of gallbladder without cholecystitis without obstruction: Secondary | ICD-10-CM

## 2018-01-19 NOTE — Patient Instructions (Signed)
Return as needed. Patient to see a message in one month . Take two pepcid two daily for a month.  patient is aware to call back for any questions or concerns.

## 2018-01-19 NOTE — Progress Notes (Signed)
Patient ID: Mckenzie SierrasCindy Arciga, female   DOB: 04/22/1971, 47 y.o.   MRN: [id number]  Chief Complaint  Patient presents with  . Routine Post Op    HPI Mckenzie Roberts is a 47 y.o. female here today for her post op cholecystectomy done on 01/08/2018. Patient states she is doing well. Moving her bowels daily.   Patient states the nausea she was experiencing before surgery abated immediately afterwards but is now recurred.  Most typically in the morning.  Last 30-45 minutes.  Not associated with vomiting.  The pain she was experiencing before surgery has resolved.  Past Medical History:  Diagnosis Date  . Depression   . Family history of adverse reaction to anesthesia    MOM-N/V  . GERD (gastroesophageal reflux disease)    OCC  . Hyperlipemia   . 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 Stroke [facility](HCC) 10/2015   TIA    Past Surgical History:  Procedure Laterality Date  . CHOLECYSTECTOMY N/A 01/08/2018   Procedure: LAPAROSCOPIC CHOLECYSTECTOMY WITH INTRAOPERATIVE CHOLANGIOGRAM;  Surgeon: Earline MayotteByrnett, Rook Maue W, MD;  Location: [facility] ORS;  Service: General;  Laterality: N/A;  . JOINT REPLACEMENT    . KNEE ARTHROSCOPY Right 05/2015  . PARTIAL KNEE ARTHROPLASTY Right 02/2016  . TOTAL KNEE REVISION Right 06/22/2017   Procedure: TOTAL KNEE REVISION;  Surgeon: Donato HeinzHooten, James P, MD;  Location: [facility] ORS;  Service: Orthopedics;  Laterality: Right;    Family History  Problem Relation Age of Onset  . Diabetes Mother   . Kidney disease Mother   . Diabetes Father   . Hypertension Father   . Thyroid disease Sister     Social History Social History   Tobacco Use  . Smoking status: Never Smoker  . Smokeless tobacco: Never Used  Substance Use Topics  . Alcohol use: Yes    Alcohol/week: 1.2 oz    Types: 2 Glasses of wine per week    Comment: OCC  . Drug use: No    Allergies  Allergen Reactions  . Niacin Other (See Comments)    Flushing- Syncope reaction after taking dose increased to 1000 mg 3 x day.  . Sulfa  Antibiotics Rash    Current Outpatient Medications  Medication Sig Dispense Refill  . aspirin EC 81 MG tablet Take 81 mg by mouth daily.    Marland Kitchen. buPROPion (WELLBUTRIN XL) 150 MG 24 hr tablet Take 150 mg by mouth every morning.     . calcium carbonate (TUMS - DOSED IN MG ELEMENTAL CALCIUM) 500 MG chewable tablet Chew 3-4 tablets by mouth daily as needed for indigestion or heartburn.    . Cholecalciferol (VITAMIN D) 2000 units tablet Take 4,000 Units by mouth daily.    Marland Kitchen. HYDROcodone-acetaminophen (NORCO/VICODIN) 5-325 MG tablet Take 1 tablet by mouth every 4 (four) hours as needed for moderate pain. 15 tablet 0  . loratadine (CLARITIN) 10 MG tablet Take 10 mg by mouth every morning.     . Naproxen Sod-Diphenhydramine (ALEVE PM) 220-25 MG TABS Take 1 tablet by mouth at bedtime as needed (sleep).    . naproxen sodium (ALEVE) 220 MG tablet Take 220 mg by mouth daily as needed (pain).    . rosuvastatin (CRESTOR) 5 MG tablet Take 5 mg by mouth every morning.      No current facility-administered medications for this visit.     Review of Systems Review of Systems  Constitutional: Negative.   Respiratory: Negative.   Cardiovascular: Negative.     Blood pressure 130/78, pulse 76, resp.  rate 14, height 5\' 2"  (1.575 m), weight 237 lb (107.5 kg).  Physical Exam Physical Exam  Constitutional: She appears well-developed and well-nourished.  Cardiovascular: Normal rate, regular rhythm and normal heart sounds.  Pulmonary/Chest: Effort normal and breath sounds normal.  Abdominal: Soft. Normal appearance and bowel sounds are normal. There is no tenderness.    Incision site are clean and healing well.   Neurological: She is alert.  Skin: Skin is dry.    Data Reviewed Pathology from the January 08, 2018 cholecystectomy:   A. GALLBLADDER; CHOLECYSTECTOMY:  - CHRONIC CHOLECYSTITIS.  - CHOLESTEROLOSIS.  - NEGATIVE FOR MALIGNANCY.  Ultrasound exam from [facility] and reported a 9 mm stone. Significant  scarring was noted near the neck of the gallbladder at the time of surgery.  Assessment    Resolution of abdominal pain, persistent mild nausea.    Plan  The patient's been asked to make use of Pepcid 2, OTC tablets at bedtime daily for 2 weeks and to report via my chart or phone whether her morning nausea is improved or not.    HPI, Physical Exam, Assessment and Plan have been scribed under the direction and in the presence of Donnalee Curry, MD.  Ples Specter, CMA  I have completed the exam and reviewed the above documentation for accuracy and completeness.  I agree with the above.  Museum/gallery conservator has been used and any errors in dictation or transcription are unintentional.  Donnalee Curry, M.D., F.A.C.S.  Merrily [place] Lupien 01/19/2018, 11:55 AM

## 2018-02-10 IMAGING — DX DG KNEE 1-2V PORT*R*
2 series · 2 of 2 positions shown · non-contrast
Comparison: None.

CLINICAL DATA: Total knee replacement

EXAM:
PORTABLE RIGHT KNEE - 1-2 VIEW

[knee ap]
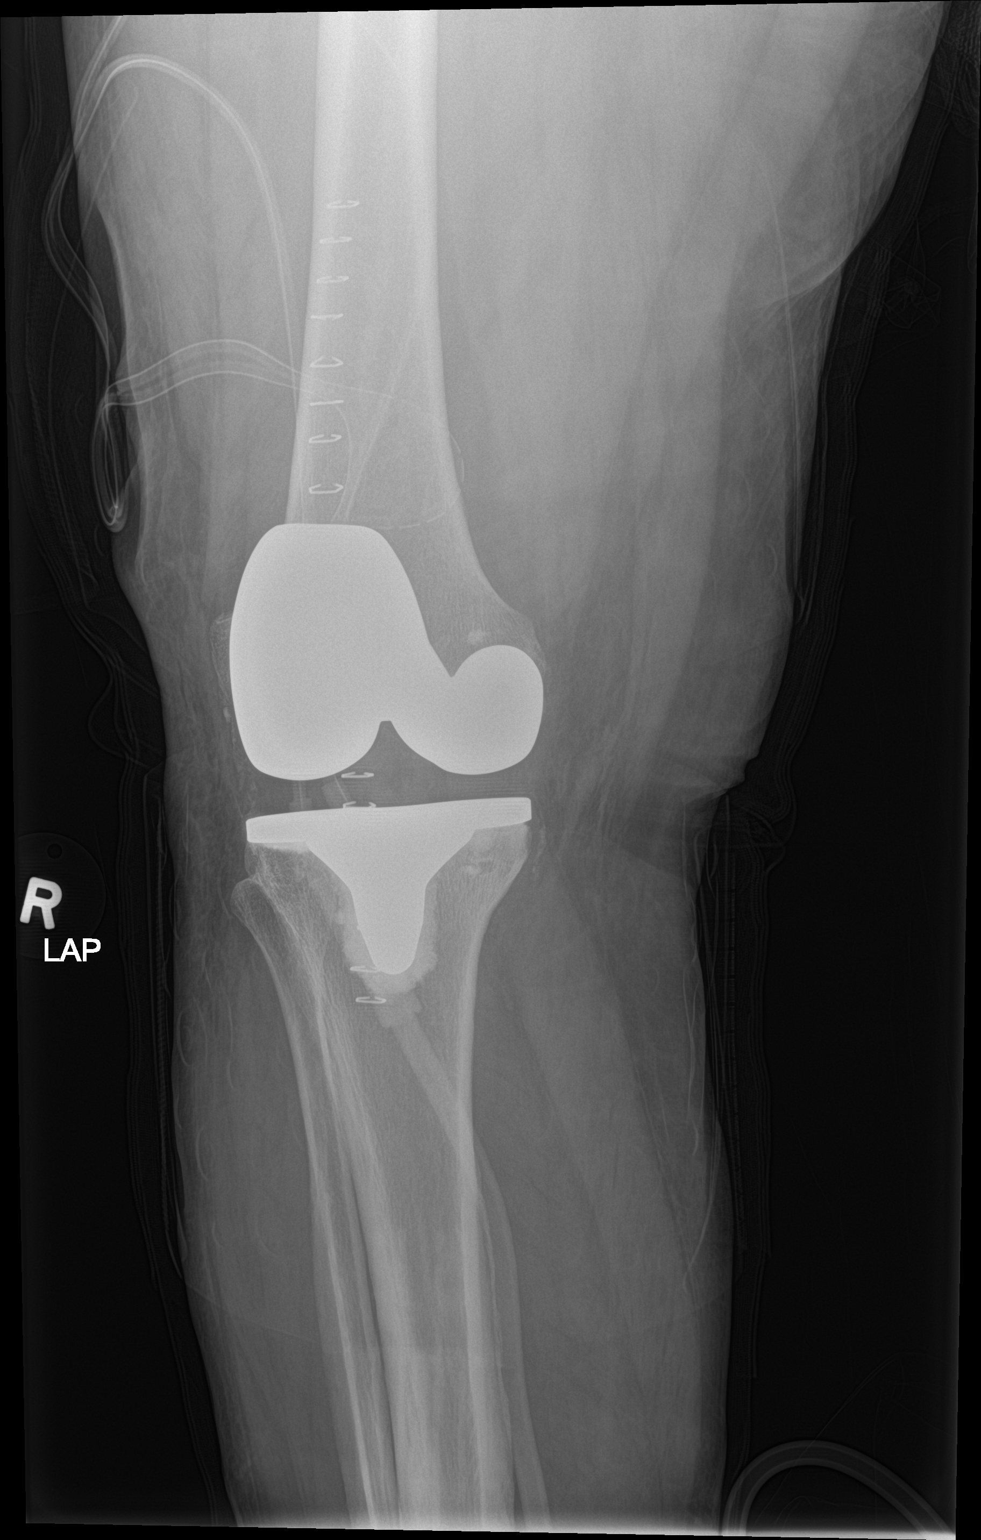

[knee lat]
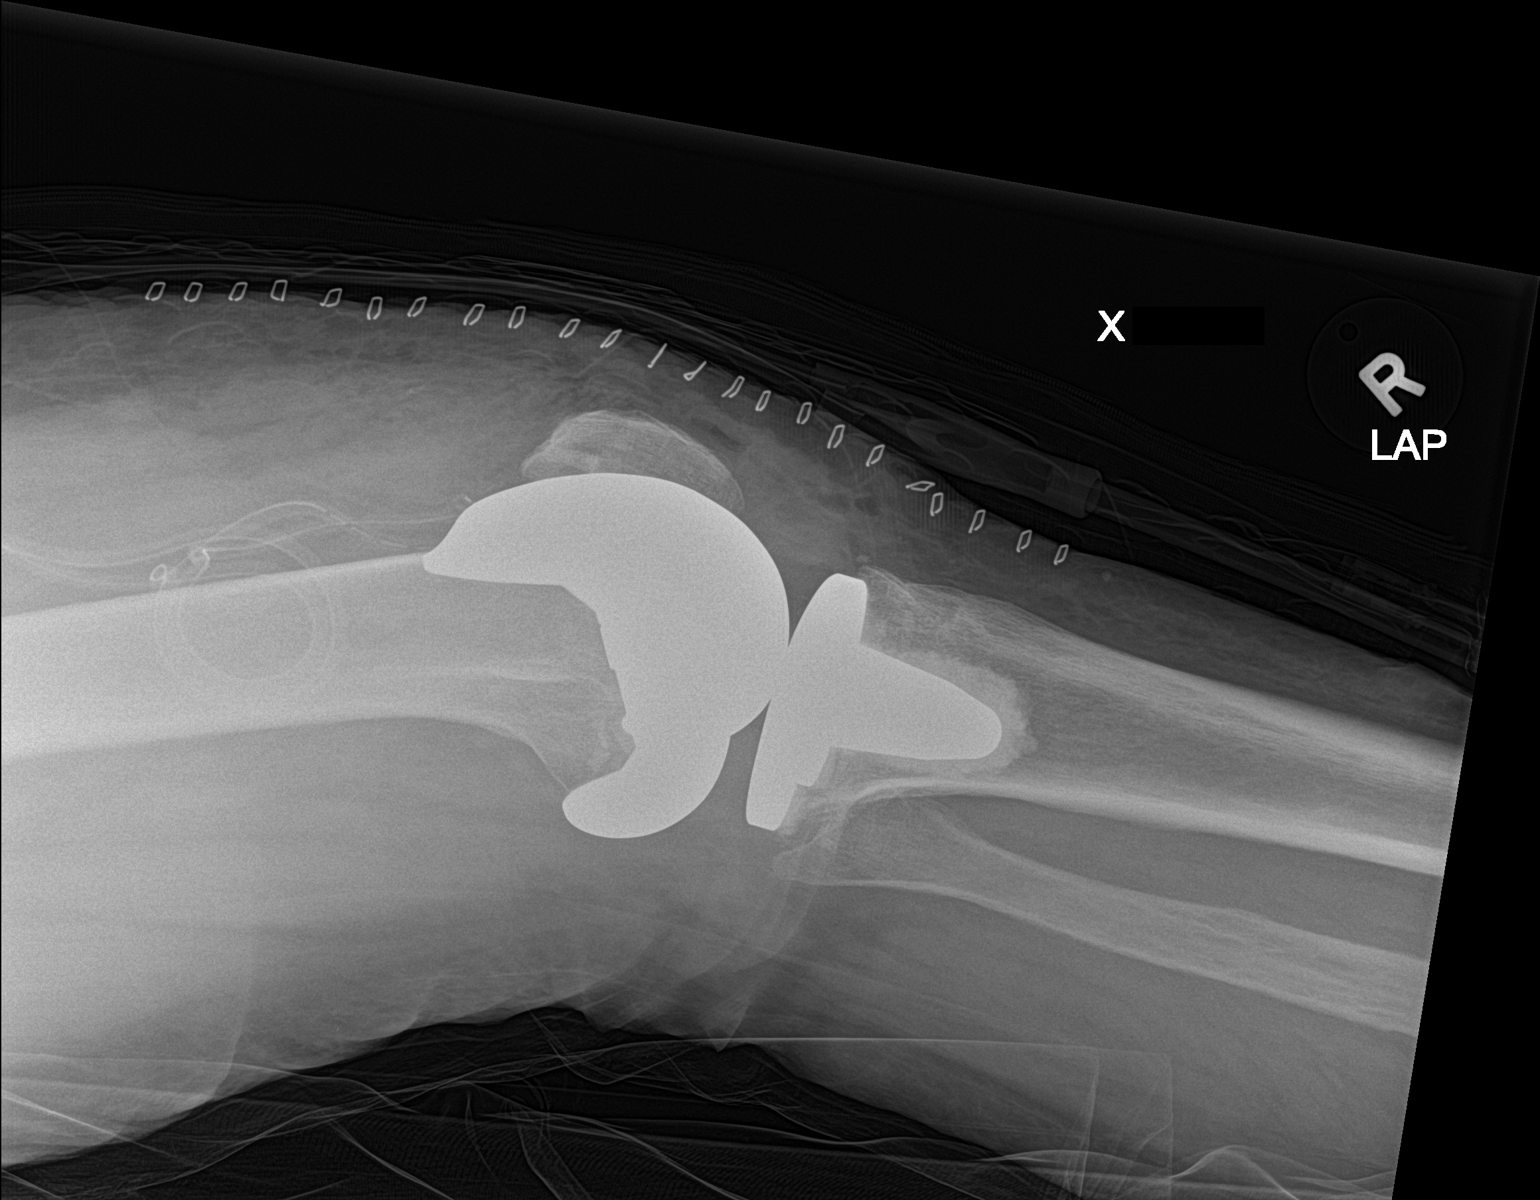

[2 of 2 positions shown; findings below may reference images not displayed]

FINDINGS: Two-view exam shows the patient be status post tricompartmental knee
replacement. No evidence for immediate hardware complications.
Surgical drains overlie the anterior soft tissues of the inferior
thigh.
IMPRESSION: No evidence for immediate hardware complications.

## 2018-02-18 ENCOUNTER — Telehealth: Payer: Self-pay | Admitting: *Deleted

## 2018-02-18 MED ORDER — METOCLOPRAMIDE HCL 10 MG PO TABS: 10.0000 mg | ORAL_TABLET | Freq: Every evening | ORAL | 0 refills | Status: AC | PRN

## 2018-02-18 NOTE — Telephone Encounter (Signed)
Notified patient as instructed, RX reviewed, patient agrees. Discussed phone follow-up in 10 days, patient agrees

## 2018-02-18 NOTE — Telephone Encounter (Signed)
Nausea persists. Will RX w/ Reglan 10 mg po at hs, repeat in AM x 1 if nausea present.  Phone f/u 10 days.

## 2018-02-18 NOTE — Telephone Encounter (Signed)
Patient called today stating that her nausea is starting back x1 week. She has been taking Pepcid and it has been helping but within the last week it has not. She had her gallbladder removed on 01/08/18. She has not had a fever or diarrhea. Patient just wants to know what the next steps she needs to take to figure out what is going on.

## 2018-06-09 ENCOUNTER — Telehealth: Payer: Self-pay | Admitting: *Deleted

## 2018-06-09 NOTE — Telephone Encounter (Signed)
-----   Message from Earline Mayotte, MD sent at 06/08/2018  8:29 PM EDT ----- Patient post cholecystectomy April 2019.  Reported recurrent mild morning nausea.  No improvement with Zantac.  Had been given a 10-day course of Reglan with request for phone follow-up, yet to be received.  Please contact.

## 2018-06-09 NOTE — Telephone Encounter (Signed)
She states that she thought it was lactose intolerance and changed her diet some, but now is eating like normal, drinking milk etc and she has not had any further nausea. She doesn't think the Reglan helped but she is glad she is not nauseated any longer. Appreciates call. The patient is aware to call back for any questions or new concerns.

## 2018-12-02 ENCOUNTER — Encounter: Payer: Self-pay | Admitting: General Surgery

## 2018-12-02 ENCOUNTER — Other Ambulatory Visit: Payer: Self-pay

## 2018-12-02 ENCOUNTER — Ambulatory Visit: Payer: 59 | Admitting: General Surgery

## 2018-12-02 VITALS — BP 138/91 | HR 84 | Temp 97.7°F | Resp 16 | Ht 62.0 in | Wt 232.6 lb

## 2018-12-02 DIAGNOSIS — R110 Nausea: Secondary | ICD-10-CM | POA: Diagnosis not present

## 2018-12-02 NOTE — Progress Notes (Signed)
Patient ID: Mckenzie Roberts, female   DOB: 17-Oct-1970, 48 y.o.   MRN: [id number]  Chief Complaint  Patient presents with  . Follow-up     f/u gallbladder removal 01/08/2018 still having nausea    HPI Mckenzie Roberts is a 48 y.o. female here today to following up for gallbladder removal in 01/08/2018 associated with nausea.  At the time of her initial postoperative visit in mid April 2019 she reported that the nausea she been experiencing has resolved.  About 1-2 months later it recurred.  She kept a dietary log for 3 days and was unable to pinpoint any offending foods, nor she appreciated a relationship between bowel movements and her symptoms.  She does not quite describe pain but rather a gnawing rumbling sensation in the epigastrium which can occur several times per day, and then may not be present for 2 days.  While she experiences some nausea, she has not experienced vomiting.  Today, she states she is feeling well.   These episodes can occur at anytime of day and have not had occurred while sleeping.  HPI  Past Medical History:  Diagnosis Date  . Depression   . Family history of adverse reaction to anesthesia    MOM-N/V  . GERD (gastroesophageal reflux disease)    OCC  . Hyperlipemia   . 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 Stroke [facility]) 10/2015   TIA    Past Surgical History:  Procedure Laterality Date  . CHOLECYSTECTOMY N/A 01/08/2018   Procedure: LAPAROSCOPIC CHOLECYSTECTOMY WITH INTRAOPERATIVE CHOLANGIOGRAM;  Surgeon: Earline Mayotte, MD;  Location: [facility] ORS;  Service: General;  Laterality: N/A;  . JOINT REPLACEMENT    . KNEE ARTHROSCOPY Right 05/2015  . PARTIAL KNEE ARTHROPLASTY Right 02/2016  . TOTAL KNEE REVISION Right 06/22/2017   Procedure: TOTAL KNEE REVISION;  Surgeon: Donato Heinz, MD;  Location: [facility] ORS;  Service: Orthopedics;  Laterality: Right;    Family History  Problem Relation Age of Onset  . Diabetes Mother   . Kidney disease Mother   . Diabetes Father   .  Hypertension Father   . Thyroid disease Sister     Social History Social History   Tobacco Use  . Smoking status: Never Smoker  . Smokeless tobacco: Never Used  Substance Use Topics  . Alcohol use: Yes    Alcohol/week: 2.0 standard drinks    Types: 2 Glasses of wine per week    Comment: OCC  . Drug use: No    Allergies  Allergen Reactions  . Niacin Other (See Comments)    Flushing- Syncope reaction after taking dose increased to 1000 mg 3 x day.  . Sulfa Antibiotics Rash    Current Outpatient Medications  Medication Sig Dispense Refill  . aspirin EC 81 MG tablet Take 81 mg by mouth daily.    Marland Kitchen buPROPion (WELLBUTRIN XL) 150 MG 24 hr tablet Take 150 mg by mouth every morning.     . Cholecalciferol (VITAMIN D) 2000 units tablet Take 4,000 Units by mouth daily.    . Cholecalciferol (VITAMIN D3 GUMMIES PO) Take by mouth QID.    Marland Kitchen loratadine (CLARITIN) 10 MG tablet Take 10 mg by mouth every morning.     . metoCLOPramide (REGLAN) 10 MG tablet Take 1 tablet (10 mg total) by mouth at bedtime and may repeat dose one time if needed. May repeat a dose in the morning if nauseated 60 tablet 0  . naproxen sodium (ALEVE) 220 MG tablet Take 220 mg by  mouth daily as needed (pain).    . calcium carbonate (TUMS - DOSED IN MG ELEMENTAL CALCIUM) 500 MG chewable tablet Chew 3-4 tablets by mouth daily as needed for indigestion or heartburn.    Marland Kitchen HYDROcodone-acetaminophen (NORCO/VICODIN) 5-325 MG tablet Take 1 tablet by mouth every 4 (four) hours as needed for moderate pain. 15 tablet 0  . Naproxen Sod-Diphenhydramine (ALEVE PM) 220-25 MG TABS Take 1 tablet by mouth at bedtime as needed (sleep).    . rosuvastatin (CRESTOR) 5 MG tablet Take 5 mg by mouth every morning.      No current facility-administered medications for this visit.     Review of Systems Review of Systems  Constitutional: Negative.   Respiratory: Negative.   Cardiovascular: Negative.     Blood pressure (!) 138/91, pulse 84,  temperature 97.7 F (36.5 C), temperature source Skin, resp. rate 16, height 5\' 2"  (1.575 m), weight 232 lb 9.6 oz (105.5 kg), last menstrual period 08/15/2018, SpO2 98 %.  Physical Exam Physical Exam Constitutional:      Appearance: She is well-developed.  Eyes:     General: No scleral icterus.    Conjunctiva/sclera: Conjunctivae normal.  Neck:     Musculoskeletal: Normal range of motion.  Cardiovascular:     Rate and Rhythm: Normal rate and regular rhythm.     Heart sounds: Normal heart sounds.  Pulmonary:     Effort: Pulmonary effort is normal.     Breath sounds: Normal breath sounds.  Chest:     Breasts:        Right: No mass.        Left: No mass.  Abdominal:     General: Abdomen is flat. Bowel sounds are normal.     Palpations: Abdomen is soft.     Tenderness: There is no abdominal tenderness.    Lymphadenopathy:     Cervical: No cervical adenopathy.  Skin:    General: Skin is warm and dry.  Neurological:     Mental Status: She is alert and oriented to person, place, and time.     Data Reviewed January 08, 2018 pathology: A. GALLBLADDER; CHOLECYSTECTOMY:  - CHRONIC CHOLECYSTITIS.  - CHOLESTEROLOSIS.  - NEGATIVE FOR MALIGNANCY.  Preoperative ultrasound suggested a single 9 mm stone. None evident at the time of cholecystectomy. Cholangiograms were normal.  Assessment Atypical epigastric pain.  Plan  I think formal GI evaluation would be in the patient's best interest.  Arrangements were made for her to be evaluated by Lynnae Prude, MD.    HPI, Physical Exam, Assessment and Plan have been scribed under the direction and in the presence of Donnalee Curry, Md.  Buddy Duty R. Rubye Oaks, CMA [place] 12/02/2018, 10:37 AM  I have completed the exam and reviewed the above documentation for accuracy and completeness.  I agree with the above.  Museum/gallery conservator has been used and any errors in dictation or transcription are unintentional.  Donnalee Curry,  M.D., F.A.C.S.  Patient has been scheduled for an appointment with Dr. Earnest Conroy P.A., [place] at [facility] for 12-06-18 at 10:30 am. The patient is aware of date, time, and instructions.   Nicholes Mango, CMA

## 2018-12-02 NOTE — Patient Instructions (Addendum)
Patient will need to return to the office as needed, patient will need a referral to see a GI doctor (Dr.Elliott).   Call the office with any questions or concerns.

## 2018-12-03 DIAGNOSIS — R110 Nausea: Secondary | ICD-10-CM | POA: Insufficient documentation

## 2019-06-13 DIAGNOSIS — E785 Hyperlipidemia, unspecified: Secondary | ICD-10-CM | POA: Insufficient documentation

## 2022-05-02 DIAGNOSIS — E7800 Pure hypercholesterolemia, unspecified: Secondary | ICD-10-CM | POA: Insufficient documentation

## 2022-08-17 DIAGNOSIS — M1712 Unilateral primary osteoarthritis, left knee: Secondary | ICD-10-CM | POA: Insufficient documentation

## 2022-11-20 ENCOUNTER — Other Ambulatory Visit: Payer: No Typology Code available for payment source

## 2022-11-20 NOTE — Discharge Instructions (Addendum)
Instructions after Total Knee Replacement   James P. Holley Bouche., M.D.     Dept. of [facility]  [place], [place]  [address]  Phone: [phone]   Fax: [phone]    DIET: Drink plenty of non-alcoholic fluids. Resume your normal diet. Include foods high in fiber.  ACTIVITY:  You may use crutches or a walker with weight-bearing as tolerated, unless instructed otherwise. You may be weaned off of the walker or crutches by your Physical Therapist.  Do NOT place pillows under the knee. Anything placed under the knee could limit your ability to straighten the knee.   Continue doing gentle exercises. Exercising will reduce the pain and swelling, increase motion, and prevent muscle weakness.   Please continue to use the TED compression stockings for 6 weeks. You may remove the stockings at night, but should reapply them in the morning. Do not drive or operate any equipment until instructed.  WOUND CARE:  Continue to use the PolarCare or ice packs periodically to reduce pain and swelling. You may bathe or shower after the staples are removed at the first office visit following surgery.  MEDICATIONS: You may resume your regular medications. Please take the pain medication as prescribed on the medication. Do not take pain medication on an empty stomach. You have been given a prescription for a blood thinner (Lovenox or Coumadin). Please take the medication as instructed. (NOTE: After completing a 2 week course of Lovenox, take one Enteric-coated aspirin twice a day. This along with elevation will help reduce the possibility of phlebitis in your operated leg.) Do not drive or drink alcoholic beverages when taking pain medications.  CALL THE OFFICE FOR: Temperature above 101 degrees Excessive bleeding or drainage on the dressing. Excessive swelling, coldness, or paleness of the toes. Persistent nausea and vomiting.  FOLLOW-UP:   You should have an appointment to return to the office in 10-14 days after surgery. Arrangements have been made for continuation of Physical Therapy (either home therapy or outpatient therapy).     [facility] Department Directory         [email or web address]       [email or web address]          Cardiology  Appointments: [place] [phone]  Endocrinology  Appointments: [place] [phone] [place] [phone]  Gastroenterology  Appointments: [place] [phone] [place] [phone]        General Surgery   Appointments: [facility]  Internal Medicine/Family Medicine  Appointments: [facility] [place] - [phone] [place] [phone]  Metabolic and Sandy Loss Surgery  Appointments: [facility]        Neurology  Appointments: [place] [phone] [place] - [phone]  Neurosurgery  Appointments: [place]  Obstetrics & Gynecology  Appointments: [place] [phone] [place] - [phone]        Pediatrics  Appointments: [place] [phone] [place] - [phone]  Physiatry  Appointments: [place] [phone]  Physical Therapy  Appointments: [place] [phone]        Podiatry  Appointments: [place] [phone] [place] - [phone]  Pulmonology  Appointments: [place]  Rheumatology  Appointments: [place] [phone]        [place] Location: [facility]  [place], [place]  [address]  [place] Location: [facility] [address] [place], [place]  [address]  [place] Location: [facility] [address] [place], [place]  [phone]

## 2022-11-21 ENCOUNTER — Other Ambulatory Visit: Payer: No Typology Code available for payment source

## 2022-11-25 ENCOUNTER — Other Ambulatory Visit: Payer: Self-pay

## 2022-11-25 ENCOUNTER — Encounter: Payer: Self-pay | Admitting: *Deleted

## 2022-11-25 ENCOUNTER — Encounter
Admission: RE | Admit: 2022-11-25 | Discharge: 2022-11-25 | Disposition: A | Discharge status: Home / Self Care | Payer: No Typology Code available for payment source | Source: Ambulatory Visit | Attending: Orthopedic Surgery | Admitting: Orthopedic Surgery

## 2022-11-25 VITALS — BP 143/88 | HR 86 | Temp 98.3°F | Resp 16 | Ht 64.0 in | Wt 233.0 lb

## 2022-11-25 DIAGNOSIS — Z01818 Encounter for other preprocedural examination: Secondary | ICD-10-CM | POA: Insufficient documentation

## 2022-11-25 DIAGNOSIS — M1712 Unilateral primary osteoarthritis, left knee: Secondary | ICD-10-CM | POA: Diagnosis not present

## 2022-11-25 DIAGNOSIS — Z01810 Encounter for preprocedural cardiovascular examination: Secondary | ICD-10-CM

## 2022-11-25 DIAGNOSIS — E119 Type 2 diabetes mellitus without complications: Secondary | ICD-10-CM

## 2022-11-25 DIAGNOSIS — Z01812 Encounter for preprocedural laboratory examination: Secondary | ICD-10-CM

## 2022-11-25 HISTORY — DX: Prediabetes: R73.03

## 2022-11-25 HISTORY — DX: Sleep apnea, unspecified: G47.30

## 2022-11-25 LAB — URINALYSIS, ROUTINE W REFLEX MICROSCOPIC
Bilirubin Urine: NEGATIVE
Glucose, UA: NEGATIVE mg/dL
Hgb urine dipstick: NEGATIVE
Ketones, ur: NEGATIVE mg/dL
Leukocytes,Ua: NEGATIVE
Nitrite: NEGATIVE
Protein, ur: NEGATIVE mg/dL
Specific Gravity, Urine: 1.015 (ref 1.005–1.030)
pH: 6 (ref 5.0–8.0)

## 2022-11-25 LAB — C-REACTIVE PROTEIN: CRP: 1.6 mg/dL — ABNORMAL HIGH (ref <1.0)

## 2022-11-25 LAB — TYPE AND SCREEN
ABO/RH(D): A POS
Antibody Screen: NEGATIVE

## 2022-11-25 LAB — CBC
HCT: 41.4 % (ref 36.0–46.0)
Hemoglobin: 13.8 g/dL (ref 12.0–15.0)
MCH: 30.4 pg (ref 26.0–34.0)
MCHC: 33.3 g/dL (ref 30.0–36.0)
MCV: 91.2 fL (ref 80.0–100.0)
Platelets: 304 10*3/uL (ref 150–400)
RBC: 4.54 MIL/uL (ref 3.87–5.11)
RDW: 13.1 % (ref 11.5–15.5)
WBC: 8.7 10*3/uL (ref 4.0–10.5)
nRBC: 0 % (ref 0.0–0.2)

## 2022-11-25 LAB — SURGICAL PCR SCREEN
MRSA, PCR: NEGATIVE
Staphylococcus aureus: NEGATIVE

## 2022-11-25 LAB — SEDIMENTATION RATE: Sed Rate: 29 mm/hr (ref 0–30)

## 2022-11-25 NOTE — Patient Instructions (Addendum)
Your procedure is scheduled on: Monday December 01, 2022.  Report to the Registration Desk on the 1st floor of the [facility]. To find out your arrival time, please call [phone] between 1PM - 3PM on: Friday November 28, 2022. If your arrival time is 6:00 am, do not arrive before that time as the [facility] entrance doors do not open until 6:00 am.  REMEMBER: Instructions that are not followed completely may result in serious medical risk, up to and including death; or upon the discretion of your surgeon and anesthesiologist your surgery may need to be rescheduled.  Do not eat food after midnight the night before surgery.  No gum chewing or hard candies.  You may however, drink CLEAR liquids up to 2 hours before you are scheduled to arrive for your surgery. Do not drink anything within 2 hours of your scheduled arrival time.  Clear liquids include: - water  - apple juice without pulp - gatorade (not RED colors) - black coffee or tea (Do NOT add milk or creamers to the coffee or tea) Do NOT drink anything that is not on this list.  Type 1 and Type 2 diabetics should only drink water.  In addition, your doctor has ordered for you to drink the provided:  Gatorade G2 Drinking this carbohydrate drink up to two hours before surgery helps to reduce insulin resistance and improve patient outcomes. Please complete drinking 2 hours before scheduled arrival time.  One week prior to surgery: Stop Anti-inflammatories (NSAIDS) such as Advil, Aleve, Ibuprofen, Motrin, Naproxen, Naprosyn and Aspirin based products such as Excedrin, Goody's Powder, BC Powder. Stop ANY OVER THE COUNTER supplements until after surgery. You may however, continue to take Tylenol if needed for pain up until the day of surgery.  Continue taking all prescribed medications with the exception of the following:  Follow recommendations from Cardiologist or PCP regarding stopping blood thinners.  TAKE ONLY THESE  MEDICATIONS THE MORNING OF SURGERY WITH A SIP OF WATER:  buPROPion (WELLBUTRIN XL) 150 MG  ezetimibe (ZETIA) 10 MG    No Alcohol for 24 hours before or after surgery.  No Smoking including e-cigarettes for 24 hours before surgery.  No chewable tobacco products for at least 6 hours before surgery.  No nicotine patches on the day of surgery.  Do not use any "recreational" drugs for at least a week (preferably 2 weeks) before your surgery.  Please be advised that the combination of cocaine and anesthesia may have negative outcomes, up to and including death. If you test positive for cocaine, your surgery will be cancelled.  On the morning of surgery brush your teeth with toothpaste and water, you may rinse your mouth with mouthwash if you wish. Do not swallow any toothpaste or mouthwash.  Use CHG Soap or wipes as directed on instruction sheet.  Do not wear jewelry, make-up, hairpins, clips or nail polish.  Do not wear lotions, powders, or perfumes.   Do not shave body hair from the neck down 48 hours before surgery.  Contact lenses, hearing aids and dentures may not be worn into surgery.  Do not bring valuables to the hospital. [facility] is not responsible for any missing/lost belongings or valuables.   Notify your doctor if there is any change in your medical condition (cold, fever, infection).  Wear comfortable clothing (specific to your surgery type) to the hospital.  After surgery, you can help prevent lung complications by doing breathing exercises.  Take deep breaths and cough  every 1-2 hours. Your doctor may order a device called an Incentive Spirometer to help you take deep breaths. When coughing or sneezing, hold a pillow firmly against your incision with both hands. This is called "splinting." Doing this helps protect your incision. It also decreases belly discomfort.  If you are being admitted to the hospital overnight, leave your suitcase in the car. After surgery it  may be brought to your room.  In case of increased patient census, it may be necessary for you, the patient, to continue your postoperative care in the Same Day Surgery department.  If you are being discharged the day of surgery, you will not be allowed to drive home. You will need a responsible individual to drive you home and stay with you for 24 hours after surgery.   If you are taking public transportation, you will need to have a responsible individual with you.  Please call the [facility]. at [phone] if you have any questions about these instructions.  Surgery Visitation Policy:  Patients undergoing a surgery or procedure may have two family members or support persons with them as long as the person is not COVID-19 positive or experiencing its symptoms.   Inpatient Visitation:    Visiting hours are 7 a.m. to 8 p.m. Up to four visitors are allowed at one time in a patient room. The visitors may rotate out with other people during the day. One designated support person (adult) may remain overnight.  Due to an increase in RSV and influenza rates and associated hospitalizations, children ages 2 and under will not be able to visit patients in [facility]. Masks continue to be strongly recommended.     Preparing for Surgery with CHLORHEXIDINE GLUCONATE (CHG) Soap  Chlorhexidine Gluconate (CHG) Soap  o An antiseptic cleaner that kills germs and bonds with the skin to continue killing germs even after washing  o Used for showering the night before surgery and morning of surgery  Before surgery, you can play an important role by reducing the number of germs on your skin.  CHG (Chlorhexidine gluconate) soap is an antiseptic cleanser which kills germs and bonds with the skin to continue killing germs even after washing.  Please do not use if you have an allergy to CHG or antibacterial soaps. If your skin becomes reddened/irritated stop using the  CHG.  1. Shower the NIGHT BEFORE SURGERY and the MORNING OF SURGERY with CHG soap.  2. If you choose to wash your hair, wash your hair first as usual with your normal shampoo.  3. After shampooing, rinse your hair and body thoroughly to remove the shampoo.  4. Use CHG as you would any other liquid soap. You can apply CHG directly to the skin and wash gently with a scrungie or a clean washcloth.  5. Apply the CHG soap to your body only from the neck down. Do not use on open wounds or open sores. Avoid contact with your eyes, ears, mouth, and genitals (private parts). Wash face and genitals (private parts) with your normal soap.  6. Wash thoroughly, paying special attention to the area where your surgery will be performed.  7. Thoroughly rinse your body with warm water.  8. Do not shower/wash with your normal soap after using and rinsing off the CHG soap.  9. Pat yourself dry with a clean towel.  10. Wear clean pajamas to bed the night before surgery.  12. Place clean sheets on your bed the night of  your first shower and do not sleep with pets.  13. Shower again with the CHG soap on the day of surgery prior to arriving at the hospital.  14. Do not apply any deodorants/lotions/powders.  15. Please wear clean clothes to the hospital.How to Use an Incentive Spirometer  An incentive spirometer is a tool that measures how well you are filling your lungs with each breath. Learning to take long, deep breaths using this tool can help you keep your lungs clear and active. This may help to reverse or lessen your chance of developing breathing (pulmonary) problems, especially infection. You may be asked to use a spirometer: After a surgery. If you have a lung problem or a history of smoking. After a long period of time when you have been unable to move or be active. If the spirometer includes an indicator to show the highest number that you have reached, your health care provider or respiratory  therapist will help you set a goal. Keep a log of your progress as told by your health care provider. What are the risks? Breathing too quickly may cause dizziness or cause you to pass out. Take your time so you do not get dizzy or light-headed. If you are in pain, you may need to take pain medicine before doing incentive spirometry. It is harder to take a deep breath if you are having pain. How to use your incentive spirometer  Sit up on the edge of your bed or on a chair. Hold the incentive spirometer so that it is in an upright position. Before you use the spirometer, breathe out normally. Place the mouthpiece in your mouth. Make sure your lips are closed tightly around it. Breathe in slowly and as deeply as you can through your mouth, causing the piston or the ball to rise toward the top of the chamber. Hold your breath for 3-5 seconds, or for as long as possible. If the spirometer includes a coach indicator, use this to guide you in breathing. Slow down your breathing if the indicator goes above the marked areas. Remove the mouthpiece from your mouth and breathe out normally. The piston or ball will return to the bottom of the chamber. Rest for a few seconds, then repeat the steps 10 or more times. Take your time and take a few normal breaths between deep breaths so that you do not get dizzy or light-headed. Do this every 1-2 hours when you are awake. If the spirometer includes a goal marker to show the highest number you have reached (best effort), use this as a goal to work toward during each repetition. After each set of 10 deep breaths, cough a few times. This will help to make sure that your lungs are clear. If you have an incision on your chest or abdomen from surgery, place a pillow or a rolled-up towel firmly against the incision when you cough. This can help to reduce pain while taking deep breaths and coughing. General tips When you are able to get out of bed: Walk around  often. Continue to take deep breaths and cough in order to clear your lungs. Keep using the incentive spirometer until your health care provider says it is okay to stop using it. If you have been in the hospital, you may be told to keep using the spirometer at home. Contact a health care provider if: You are having difficulty using the spirometer. You have trouble using the spirometer as often as instructed. Your pain medicine is  not giving enough relief for you to use the spirometer as told. You have a fever. Get help right away if: You develop shortness of breath. You develop a cough with bloody mucus from the lungs. You have fluid or blood coming from an incision site after you cough. Summary An incentive spirometer is a tool that can help you learn to take long, deep breaths to keep your lungs clear and active. You may be asked to use a spirometer after a surgery, if you have a lung problem or a history of smoking, or if you have been inactive for a long period of time. Use your incentive spirometer as instructed every 1-2 hours while you are awake. If you have an incision on your chest or abdomen, place a pillow or a rolled-up towel firmly against your incision when you cough. This will help to reduce pain. Get help right away if you have shortness of breath, you cough up bloody mucus, or blood comes from your incision when you cough. This information is not intended to replace advice given to you by your health care provider. Make sure you discuss any questions you have with your health care provider. Document Revised: 12/12/2019 Document Reviewed: 12/12/2019 Elsevier Patient Education  [place].

## 2022-12-01 ENCOUNTER — Observation Stay: Payer: No Typology Code available for payment source

## 2022-12-01 ENCOUNTER — Other Ambulatory Visit: Payer: Self-pay

## 2022-12-01 ENCOUNTER — Observation Stay
Admission: RE | Admit: 2022-12-01 | Discharge: 2022-12-02 | Disposition: A | Discharge status: Home / Self Care | Payer: No Typology Code available for payment source | Attending: Orthopedic Surgery | Admitting: Orthopedic Surgery

## 2022-12-01 ENCOUNTER — Ambulatory Visit: Payer: No Typology Code available for payment source | Admitting: Urgent Care

## 2022-12-01 ENCOUNTER — Encounter
Admission: RE | Disposition: A | Discharge status: Home / Self Care | Payer: Self-pay | Source: Home / Self Care | Attending: Orthopedic Surgery

## 2022-12-01 ENCOUNTER — Ambulatory Visit: Payer: No Typology Code available for payment source | Admitting: Certified Registered"

## 2022-12-01 ENCOUNTER — Encounter: Payer: Self-pay | Admitting: Orthopedic Surgery

## 2022-12-01 DIAGNOSIS — R7309 Other abnormal glucose: Secondary | ICD-10-CM | POA: Diagnosis not present

## 2022-12-01 DIAGNOSIS — Z96651 Presence of right artificial knee joint: Secondary | ICD-10-CM | POA: Insufficient documentation

## 2022-12-01 DIAGNOSIS — M1712 Unilateral primary osteoarthritis, left knee: Principal | ICD-10-CM | POA: Insufficient documentation

## 2022-12-01 DIAGNOSIS — E119 Type 2 diabetes mellitus without complications: Secondary | ICD-10-CM

## 2022-12-01 DIAGNOSIS — Z6839 Body mass index (BMI) 39.0-39.9, adult: Secondary | ICD-10-CM | POA: Insufficient documentation

## 2022-12-01 DIAGNOSIS — Z96659 Presence of unspecified artificial knee joint: Secondary | ICD-10-CM

## 2022-12-01 HISTORY — PX: KNEE ARTHROPLASTY: SHX992

## 2022-12-01 LAB — GLUCOSE, CAPILLARY
Glucose-Capillary: 111 mg/dL — ABNORMAL HIGH (ref 70–99)
Glucose-Capillary: 168 mg/dL — ABNORMAL HIGH (ref 70–99)
Glucose-Capillary: 214 mg/dL — ABNORMAL HIGH (ref 70–99)
Glucose-Capillary: 160 mg/dL — ABNORMAL HIGH (ref 70–99)

## 2022-12-01 SURGERY — ARTHROPLASTY, KNEE, TOTAL, USING IMAGELESS COMPUTER-ASSISTED NAVIGATION
Anesthesia: Spinal | Site: Knee | Laterality: Left

## 2022-12-01 MED ORDER — CEFAZOLIN SODIUM-DEXTROSE 2-4 GM/100ML-% IV SOLN
INTRAVENOUS | Status: AC
  Filled 2022-12-01: qty 100

## 2022-12-01 MED ORDER — 0.9 % SODIUM CHLORIDE (POUR BTL) OPTIME
TOPICAL | Status: DC | PRN
  Administered 2022-12-01: 1000 mL

## 2022-12-01 MED ORDER — OXYCODONE HCL 5 MG PO TABS: 10.0000 mg | ORAL_TABLET | ORAL | Status: DC | PRN

## 2022-12-01 MED ORDER — CELECOXIB 200 MG PO CAPS: 200.0000 mg | ORAL_CAPSULE | Freq: Two times a day (BID) | ORAL | Status: DC

## 2022-12-01 MED ORDER — MAGNESIUM HYDROXIDE 400 MG/5ML PO SUSP
30.0000 mL | Freq: Every day | ORAL | Status: DC
  Administered 2022-12-01: 30 mL via ORAL

## 2022-12-01 MED ORDER — SENNOSIDES-DOCUSATE SODIUM 8.6-50 MG PO TABS
ORAL_TABLET | ORAL | Status: AC
  Administered 2022-12-01: 1 via ORAL
  Filled 2022-12-01: qty 1

## 2022-12-01 MED ORDER — TRAMADOL HCL 50 MG PO TABS
ORAL_TABLET | ORAL | Status: AC
  Administered 2022-12-01: 50 mg via ORAL
  Filled 2022-12-01: qty 1

## 2022-12-01 MED ORDER — PROPOFOL 500 MG/50ML IV EMUL
INTRAVENOUS | Status: DC | PRN
  Administered 2022-12-01: 75 ug/kg/min via INTRAVENOUS

## 2022-12-01 MED ORDER — TRAMADOL HCL 50 MG PO TABS
ORAL_TABLET | ORAL | Status: AC
  Filled 2022-12-01: qty 1

## 2022-12-01 MED ORDER — DEXAMETHASONE SODIUM PHOSPHATE 10 MG/ML IJ SOLN
INTRAMUSCULAR | Status: AC
  Administered 2022-12-01: 8 mg via INTRAVENOUS
  Filled 2022-12-01: qty 1

## 2022-12-01 MED ORDER — TRANEXAMIC ACID-NACL 1000-0.7 MG/100ML-% IV SOLN
INTRAVENOUS | Status: AC
  Filled 2022-12-01: qty 100

## 2022-12-01 MED ORDER — MIDAZOLAM HCL 5 MG/5ML IJ SOLN
INTRAMUSCULAR | Status: DC | PRN
  Administered 2022-12-01 (×2): 1 mg via INTRAVENOUS

## 2022-12-01 MED ORDER — KETAMINE HCL 10 MG/ML IJ SOLN
INTRAMUSCULAR | Status: DC | PRN
  Administered 2022-12-01: 20 mg via INTRAVENOUS
  Administered 2022-12-01: 10 mg via INTRAVENOUS
  Administered 2022-12-01: 20 mg via INTRAVENOUS

## 2022-12-01 MED ORDER — OXYCODONE HCL 5 MG PO TABS: 5.0000 mg | ORAL_TABLET | Freq: Once | ORAL | Status: DC | PRN

## 2022-12-01 MED ORDER — FAMOTIDINE 20 MG PO TABS: 20.0000 mg | ORAL_TABLET | Freq: Once | ORAL | Status: AC

## 2022-12-01 MED ORDER — ONDANSETRON HCL 4 MG/2ML IJ SOLN: 4.0000 mg | Freq: Four times a day (QID) | INTRAMUSCULAR | Status: DC | PRN

## 2022-12-01 MED ORDER — FAMOTIDINE 20 MG PO TABS
ORAL_TABLET | ORAL | Status: AC
  Administered 2022-12-01: 20 mg via ORAL
  Filled 2022-12-01: qty 1

## 2022-12-01 MED ORDER — CELECOXIB 200 MG PO CAPS: 400.0000 mg | ORAL_CAPSULE | Freq: Once | ORAL | Status: AC

## 2022-12-01 MED ORDER — BUPIVACAINE HCL (PF) 0.5 % IJ SOLN
INTRAMUSCULAR | Status: DC | PRN
  Administered 2022-12-01: 2.8 mL

## 2022-12-01 MED ORDER — PROPOFOL 1000 MG/100ML IV EMUL
INTRAVENOUS | Status: AC
  Filled 2022-12-01: qty 100

## 2022-12-01 MED ORDER — CHLORHEXIDINE GLUCONATE 0.12 % MT SOLN: 15.0000 mL | Freq: Once | OROMUCOSAL | Status: AC

## 2022-12-01 MED ORDER — HYDROMORPHONE HCL 1 MG/ML IJ SOLN
INTRAMUSCULAR | Status: AC
  Administered 2022-12-01: 0.5 mg via INTRAVENOUS
  Filled 2022-12-01: qty 0.5

## 2022-12-01 MED ORDER — TRAMADOL HCL 50 MG PO TABS
50.0000 mg | ORAL_TABLET | ORAL | Status: DC | PRN
  Administered 2022-12-01: 50 mg via ORAL

## 2022-12-01 MED ORDER — BISACODYL 10 MG RE SUPP: 10.0000 mg | Freq: Every day | RECTAL | Status: DC | PRN

## 2022-12-01 MED ORDER — PROPOFOL 10 MG/ML IV BOLUS
INTRAVENOUS | Status: AC
  Filled 2022-12-01: qty 20

## 2022-12-01 MED ORDER — METOCLOPRAMIDE HCL 10 MG PO TABS
10.0000 mg | ORAL_TABLET | Freq: Three times a day (TID) | ORAL | Status: DC
  Administered 2022-12-02: 10 mg via ORAL

## 2022-12-01 MED ORDER — BUPROPION HCL ER (XL) 150 MG PO TB24
150.0000 mg | ORAL_TABLET | ORAL | Status: DC
  Administered 2022-12-02: 150 mg via ORAL
  Filled 2022-12-01: qty 1

## 2022-12-01 MED ORDER — MIDAZOLAM HCL 2 MG/2ML IJ SOLN
INTRAMUSCULAR | Status: AC
  Filled 2022-12-01: qty 2

## 2022-12-01 MED ORDER — FENTANYL CITRATE (PF) 100 MCG/2ML IJ SOLN
INTRAMUSCULAR | Status: AC
  Filled 2022-12-01: qty 2

## 2022-12-01 MED ORDER — TRANEXAMIC ACID-NACL 1000-0.7 MG/100ML-% IV SOLN
INTRAVENOUS | Status: AC
  Administered 2022-12-01: 1000 mg via INTRAVENOUS
  Filled 2022-12-01: qty 100

## 2022-12-01 MED ORDER — PANTOPRAZOLE SODIUM 40 MG PO TBEC
DELAYED_RELEASE_TABLET | ORAL | Status: AC
  Administered 2022-12-01: 40 mg via ORAL
  Filled 2022-12-01: qty 1

## 2022-12-01 MED ORDER — CELECOXIB 200 MG PO CAPS
ORAL_CAPSULE | ORAL | Status: AC
  Administered 2022-12-01: 400 mg via ORAL
  Filled 2022-12-01: qty 2

## 2022-12-01 MED ORDER — CEFAZOLIN SODIUM-DEXTROSE 2-4 GM/100ML-% IV SOLN
INTRAVENOUS | Status: AC
  Administered 2022-12-01: 2 g via INTRAVENOUS
  Filled 2022-12-01: qty 100

## 2022-12-01 MED ORDER — MENTHOL 3 MG MT LOZG: 1.0000 | LOZENGE | OROMUCOSAL | Status: DC | PRN

## 2022-12-01 MED ORDER — ORAL CARE MOUTH RINSE: 15.0000 mL | Freq: Once | OROMUCOSAL | Status: AC

## 2022-12-01 MED ORDER — ALUM & MAG HYDROXIDE-SIMETH 200-200-20 MG/5ML PO SUSP: 30.0000 mL | ORAL | Status: DC | PRN

## 2022-12-01 MED ORDER — DEXAMETHASONE SODIUM PHOSPHATE 10 MG/ML IJ SOLN: 8.0000 mg | Freq: Once | INTRAMUSCULAR | Status: AC

## 2022-12-01 MED ORDER — ACETAMINOPHEN 10 MG/ML IV SOLN
INTRAVENOUS | Status: AC
  Filled 2022-12-01: qty 100

## 2022-12-01 MED ORDER — FLEET ENEMA 7-19 GM/118ML RE ENEM: 1.0000 | ENEMA | Freq: Once | RECTAL | Status: DC | PRN

## 2022-12-01 MED ORDER — SENNOSIDES-DOCUSATE SODIUM 8.6-50 MG PO TABS: 1.0000 | ORAL_TABLET | Freq: Two times a day (BID) | ORAL | Status: DC

## 2022-12-01 MED ORDER — PANTOPRAZOLE SODIUM 40 MG PO TBEC: 40.0000 mg | DELAYED_RELEASE_TABLET | Freq: Two times a day (BID) | ORAL | Status: DC

## 2022-12-01 MED ORDER — SODIUM CHLORIDE 0.9 % IV SOLN
INTRAVENOUS | Status: DC | PRN
  Administered 2022-12-01: 60 mL

## 2022-12-01 MED ORDER — SODIUM CHLORIDE 0.9 % IR SOLN
Status: DC | PRN
  Administered 2022-12-01: 3000 mL

## 2022-12-01 MED ORDER — SURGIRINSE WOUND IRRIGATION SYSTEM - OPTIME
TOPICAL | Status: DC | PRN
  Administered 2022-12-01: 450 mL

## 2022-12-01 MED ORDER — ACETAMINOPHEN 10 MG/ML IV SOLN
INTRAVENOUS | Status: DC | PRN
  Administered 2022-12-01: 1000 mg via INTRAVENOUS

## 2022-12-01 MED ORDER — BUPIVACAINE HCL (PF) 0.25 % IJ SOLN
INTRAMUSCULAR | Status: DC | PRN
  Administered 2022-12-01: 60 mL

## 2022-12-01 MED ORDER — INSULIN ASPART 100 UNIT/ML IJ SOLN: 0.0000 [IU] | Freq: Every day | INTRAMUSCULAR | Status: DC

## 2022-12-01 MED ORDER — SODIUM CHLORIDE 0.9 % IV SOLN: INTRAVENOUS | Status: DC

## 2022-12-01 MED ORDER — OXYCODONE HCL 5 MG PO TABS
ORAL_TABLET | ORAL | Status: AC
  Administered 2022-12-01: 5 mg via ORAL
  Filled 2022-12-01: qty 1

## 2022-12-01 MED ORDER — EZETIMIBE 10 MG PO TABS: 10.0000 mg | ORAL_TABLET | Freq: Every day | ORAL | Status: DC

## 2022-12-01 MED ORDER — ACETAMINOPHEN 325 MG PO TABS: 325.0000 mg | ORAL_TABLET | Freq: Four times a day (QID) | ORAL | Status: DC | PRN

## 2022-12-01 MED ORDER — OXYCODONE HCL 5 MG PO TABS
ORAL_TABLET | ORAL | Status: AC
  Filled 2022-12-01: qty 1

## 2022-12-01 MED ORDER — KETAMINE HCL 50 MG/5ML IJ SOSY
PREFILLED_SYRINGE | INTRAMUSCULAR | Status: AC
  Filled 2022-12-01: qty 5

## 2022-12-01 MED ORDER — CEFAZOLIN SODIUM-DEXTROSE 2-4 GM/100ML-% IV SOLN
2.0000 g | Freq: Four times a day (QID) | INTRAVENOUS | Status: AC
  Administered 2022-12-01: 2 g via INTRAVENOUS

## 2022-12-01 MED ORDER — METOCLOPRAMIDE HCL 10 MG PO TABS
ORAL_TABLET | ORAL | Status: AC
  Administered 2022-12-01: 10 mg via ORAL
  Filled 2022-12-01: qty 1

## 2022-12-01 MED ORDER — GABAPENTIN 300 MG PO CAPS: 300.0000 mg | ORAL_CAPSULE | Freq: Once | ORAL | Status: AC

## 2022-12-01 MED ORDER — DIPHENHYDRAMINE HCL 12.5 MG/5ML PO ELIX: 12.5000 mg | ORAL_SOLUTION | ORAL | Status: DC | PRN

## 2022-12-01 MED ORDER — METFORMIN HCL 500 MG PO TABS: 500.0000 mg | ORAL_TABLET | Freq: Two times a day (BID) | ORAL | Status: DC

## 2022-12-01 MED ORDER — ACETAMINOPHEN 10 MG/ML IV SOLN
1000.0000 mg | Freq: Four times a day (QID) | INTRAVENOUS | Status: DC
  Administered 2022-12-02: 1000 mg via INTRAVENOUS
  Filled 2022-12-01: qty 100

## 2022-12-01 MED ORDER — CEFAZOLIN SODIUM-DEXTROSE 2-4 GM/100ML-% IV SOLN
2.0000 g | INTRAVENOUS | Status: AC
  Administered 2022-12-01: 2 g via INTRAVENOUS

## 2022-12-01 MED ORDER — ONDANSETRON HCL 4 MG PO TABS: 4.0000 mg | ORAL_TABLET | Freq: Four times a day (QID) | ORAL | Status: DC | PRN

## 2022-12-01 MED ORDER — PHENYLEPHRINE HCL-NACL 20-0.9 MG/250ML-% IV SOLN
INTRAVENOUS | Status: DC | PRN
  Administered 2022-12-01: 30 ug/min via INTRAVENOUS

## 2022-12-01 MED ORDER — ENOXAPARIN SODIUM 30 MG/0.3ML IJ SOSY: 30.0000 mg | PREFILLED_SYRINGE | Freq: Two times a day (BID) | INTRAMUSCULAR | Status: DC

## 2022-12-01 MED ORDER — CHLORHEXIDINE GLUCONATE 0.12 % MT SOLN
OROMUCOSAL | Status: AC
  Administered 2022-12-01: 15 mL via OROMUCOSAL
  Filled 2022-12-01: qty 15

## 2022-12-01 MED ORDER — PHENYLEPHRINE HCL (PRESSORS) 10 MG/ML IV SOLN
INTRAVENOUS | Status: DC | PRN
  Administered 2022-12-01 (×2): 80 ug via INTRAVENOUS
  Administered 2022-12-01: 160 ug via INTRAVENOUS
  Administered 2022-12-01 (×2): 80 ug via INTRAVENOUS

## 2022-12-01 MED ORDER — GABAPENTIN 300 MG PO CAPS
ORAL_CAPSULE | ORAL | Status: AC
  Administered 2022-12-01: 300 mg via ORAL
  Filled 2022-12-01: qty 1

## 2022-12-01 MED ORDER — OXYCODONE HCL 5 MG/5ML PO SOLN: 5.0000 mg | Freq: Once | ORAL | Status: DC | PRN

## 2022-12-01 MED ORDER — ONDANSETRON HCL 4 MG/2ML IJ SOLN
INTRAMUSCULAR | Status: DC | PRN
  Administered 2022-12-01: 4 mg via INTRAVENOUS

## 2022-12-01 MED ORDER — LORATADINE 10 MG PO TABS
10.0000 mg | ORAL_TABLET | ORAL | Status: DC
  Administered 2022-12-02: 10 mg via ORAL

## 2022-12-01 MED ORDER — TRANEXAMIC ACID-NACL 1000-0.7 MG/100ML-% IV SOLN: 1000.0000 mg | Freq: Once | INTRAVENOUS | Status: AC

## 2022-12-01 MED ORDER — CELECOXIB 200 MG PO CAPS
ORAL_CAPSULE | ORAL | Status: AC
  Administered 2022-12-01: 200 mg via ORAL
  Filled 2022-12-01: qty 1

## 2022-12-01 MED ORDER — FENTANYL CITRATE (PF) 100 MCG/2ML IJ SOLN
INTRAMUSCULAR | Status: DC | PRN
  Administered 2022-12-01 (×2): 25 ug via INTRAVENOUS
  Administered 2022-12-01: 50 ug via INTRAVENOUS

## 2022-12-01 MED ORDER — TRANEXAMIC ACID-NACL 1000-0.7 MG/100ML-% IV SOLN
1000.0000 mg | INTRAVENOUS | Status: AC
  Administered 2022-12-01: 1000 mg via INTRAVENOUS

## 2022-12-01 MED ORDER — HYDROMORPHONE HCL 1 MG/ML IJ SOLN: 0.5000 mg | INTRAMUSCULAR | Status: DC | PRN

## 2022-12-01 MED ORDER — FENTANYL CITRATE (PF) 100 MCG/2ML IJ SOLN: 25.0000 ug | INTRAMUSCULAR | Status: DC | PRN

## 2022-12-01 MED ORDER — OXYCODONE HCL 5 MG PO TABS
5.0000 mg | ORAL_TABLET | ORAL | Status: DC | PRN
  Administered 2022-12-01 – 2022-12-02 (×3): 5 mg via ORAL

## 2022-12-01 MED ORDER — MAGNESIUM HYDROXIDE 400 MG/5ML PO SUSP
ORAL | Status: AC
  Filled 2022-12-01: qty 30

## 2022-12-01 MED ORDER — PHENOL 1.4 % MT LIQD: 1.0000 | OROMUCOSAL | Status: DC | PRN

## 2022-12-01 MED ORDER — ACETAMINOPHEN 10 MG/ML IV SOLN
INTRAVENOUS | Status: AC
  Administered 2022-12-01: 1000 mg via INTRAVENOUS
  Filled 2022-12-01: qty 100

## 2022-12-01 MED ORDER — INSULIN ASPART 100 UNIT/ML IJ SOLN: 0.0000 [IU] | Freq: Three times a day (TID) | INTRAMUSCULAR | Status: DC

## 2022-12-01 SURGICAL SUPPLY — 69 items
ATTUNE PSFEM LTSZ5 NARCEM KNEE (Femur) IMPLANT
ATTUNE PSRP INSR SZ5 5 KNEE (Insert) IMPLANT
BASEPLATE TIBIAL ROTATING SZ 4 (Knees) IMPLANT
BATTERY INSTRU NAVIGATION (MISCELLANEOUS) ×4 IMPLANT
BLADE SAW 70X12.5 (BLADE) ×1 IMPLANT
BLADE SAW 90X13X1.19 OSCILLAT (BLADE) ×1 IMPLANT
BLADE SAW 90X25X1.19 OSCILLAT (BLADE) ×1 IMPLANT
BONE CEMENT GENTAMICIN (Cement) ×2 IMPLANT
CEMENT BONE GENTAMICIN 40 (Cement) IMPLANT
COOLER POLAR GLACIER W/PUMP (MISCELLANEOUS) ×1 IMPLANT
CUFF TOURN SGL QUICK 24 (TOURNIQUET CUFF)
CUFF TOURN SGL QUICK 34 (TOURNIQUET CUFF)
CUFF TRNQT CYL 24X4X16.5-23 (TOURNIQUET CUFF) IMPLANT
CUFF TRNQT CYL 34X4.125X (TOURNIQUET CUFF) IMPLANT
DRAPE 3/4 80X56 (DRAPES) ×1 IMPLANT
DRAPE INCISE IOBAN 66X45 STRL (DRAPES) IMPLANT
DRSG DERMACEA 8X12 NADH (GAUZE/BANDAGES/DRESSINGS) IMPLANT
DRSG MEPILEX SACRM 8.7X9.8 (GAUZE/BANDAGES/DRESSINGS) ×1 IMPLANT
DRSG NON-ADHERENT DERMACEA 3X4 (GAUZE/BANDAGES/DRESSINGS) ×1 IMPLANT
DRSG OPSITE POSTOP 4X14 (GAUZE/BANDAGES/DRESSINGS) ×1 IMPLANT
DRSG TEGADERM 4X4.75 (GAUZE/BANDAGES/DRESSINGS) ×1 IMPLANT
DURAPREP 26ML APPLICATOR (WOUND CARE) ×2 IMPLANT
ELECT CAUTERY BLADE 6.4 (BLADE) ×1 IMPLANT
ELECT REM PT RETURN 9FT ADLT (ELECTROSURGICAL) ×1
ELECTRODE REM PT RTRN 9FT ADLT (ELECTROSURGICAL) ×1 IMPLANT
EX-PIN ORTHOLOCK NAV 4X150 (PIN) ×2 IMPLANT
GLOVE BIOGEL M STRL SZ7.5 (GLOVE) ×2 IMPLANT
GLOVE SRG 8 PF TXTR STRL LF DI (GLOVE) ×1 IMPLANT
GLOVE SURG UNDER POLY LF SZ8 (GLOVE) ×1
GOWN STRL REUS W/ TWL LRG LVL3 (GOWN DISPOSABLE) ×1 IMPLANT
GOWN STRL REUS W/TWL LRG LVL3 (GOWN DISPOSABLE) ×1
GOWN TOGA ZIPPER T7+ PEEL AWAY (MISCELLANEOUS) ×1 IMPLANT
HEMOVAC 400CC 10FR (MISCELLANEOUS) ×1 IMPLANT
HOLDER FOLEY CATH W/STRAP (MISCELLANEOUS) ×1 IMPLANT
IV NS IRRIG 3000ML ARTHROMATIC (IV SOLUTION) ×1 IMPLANT
KIT TURNOVER KIT A (KITS) ×1 IMPLANT
KNIFE SCULPS 14X20 (INSTRUMENTS) ×1 IMPLANT
MANIFOLD NEPTUNE II (INSTRUMENTS) ×2 IMPLANT
NDL SPNL 20GX3.5 QUINCKE YW (NEEDLE) ×2 IMPLANT
NEEDLE SPNL 20GX3.5 QUINCKE YW (NEEDLE) ×2 IMPLANT
NS IRRIG 500ML POUR BTL (IV SOLUTION) ×1 IMPLANT
PACK TOTAL KNEE (MISCELLANEOUS) ×1 IMPLANT
PAD ABD DERMACEA PRESS 5X9 (GAUZE/BANDAGES/DRESSINGS) ×2 IMPLANT
PAD WRAPON POLAR KNEE (MISCELLANEOUS) ×1 IMPLANT
PATELLA MEDIAL ATTUN 35MM KNEE (Knees) IMPLANT
PIN DRILL FIX HALF THREAD (BIT) ×2 IMPLANT
PIN DRILL QUICK PACK ×2 IMPLANT
PIN FIXATION 1/8DIA X 3INL (PIN) ×1 IMPLANT
PULSAVAC PLUS IRRIG FAN TIP (DISPOSABLE) ×1
SOL PREP PVP 2OZ (MISCELLANEOUS) ×1
SOLUTION IRRIG SURGIPHOR (IV SOLUTION) ×1 IMPLANT
SOLUTION PREP PVP 2OZ (MISCELLANEOUS) ×1 IMPLANT
SPONGE DRAIN TRACH 4X4 STRL 2S (GAUZE/BANDAGES/DRESSINGS) ×1 IMPLANT
STAPLER SKIN PROX 35W (STAPLE) ×1 IMPLANT
STOCKINETTE IMPERV 14X48 (MISCELLANEOUS) ×1 IMPLANT
STRAP TIBIA SHORT (MISCELLANEOUS) ×1 IMPLANT
SUCTION FRAZIER HANDLE 10FR (MISCELLANEOUS) ×1
SUCTION TUBE FRAZIER 10FR DISP (MISCELLANEOUS) ×1 IMPLANT
SUT VIC AB 0 CT1 36 (SUTURE) ×1 IMPLANT
SUT VIC AB 1 CT1 36 (SUTURE) ×2 IMPLANT
SUT VIC AB 2-0 CT2 27 (SUTURE) ×1 IMPLANT
SYR 30ML LL (SYRINGE) ×2 IMPLANT
TIP FAN IRRIG PULSAVAC PLUS (DISPOSABLE) ×1 IMPLANT
TOWEL OR 17X26 4PK STRL BLUE (TOWEL DISPOSABLE) IMPLANT
TOWER CARTRIDGE SMART MIX (DISPOSABLE) ×1 IMPLANT
TRAP FLUID SMOKE EVACUATOR (MISCELLANEOUS) ×1 IMPLANT
TRAY FOLEY MTR SLVR 16FR STAT (SET/KITS/TRAYS/PACK) ×1 IMPLANT
WATER STERILE IRR 1000ML POUR (IV SOLUTION) ×1 IMPLANT
WRAPON POLAR PAD KNEE (MISCELLANEOUS) ×1

## 2022-12-01 NOTE — Progress Notes (Signed)
PT Cancellation Note  Patient Details Name: Mckenzie Roberts MRN: [id number] DOB: Nov 22, 1970   Cancelled Treatment:    Reason Eval/Treat Not Completed: Patient not medically ready.  Per nursing pt continues to present with decreased sensation to light touch and with decreased functional strength/AROM and is not appropriate to participate with PT services at this time.  Will attempt to see pt at a future date/time as medically appropriate.     Linus Salmons PT, DPT 12/01/22, 4:27 PM

## 2022-12-01 NOTE — Anesthesia Preprocedure Evaluation (Signed)
Anesthesia Evaluation  Patient identified by MRN, date of birth, ID band Patient awake    Reviewed: Allergy & Precautions, NPO status , Patient's Chart, lab work & pertinent test results  History of Anesthesia Complications Negative for: history of anesthetic complications  Airway Mallampati: III  TM Distance: >3 FB Neck ROM: full    Dental  (+) Chipped   Pulmonary neg shortness of breath, sleep apnea and Continuous Positive Airway Pressure Ventilation    Pulmonary exam normal        Cardiovascular (-) angina (-) Past MI negative cardio ROS Normal cardiovascular exam     Neuro/Psych  PSYCHIATRIC DISORDERS      CVA    GI/Hepatic Neg liver ROS,GERD  Controlled,,  Endo/Other  negative endocrine ROS    Renal/GU Renal disease     Musculoskeletal   Abdominal   Peds  Hematology negative hematology ROS (+)   Anesthesia Other Findings Past Medical History: No date: Depression No date: Family history of adverse reaction to anesthesia     Comment:  MOM-N/V No date: GERD (gastroesophageal reflux disease)     Comment:  OCC No date: Hyperlipemia No date: Pre-diabetes No date: Sleep apnea 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[place]) 10/2015: Stroke ([place])     Comment:  TIA  Past Surgical History: 01/08/2018: CHOLECYSTECTOMY; N/A     Comment:  Procedure: LAPAROSCOPIC CHOLECYSTECTOMY WITH               INTRAOPERATIVE CHOLANGIOGRAM;  Surgeon: Robert Bellow, MD;  Location: [facility] ORS;  Service: General;                Laterality: N/A; No date: JOINT REPLACEMENT 05/2015: KNEE ARTHROSCOPY; Right 02/2016: PARTIAL KNEE ARTHROPLASTY; Right 06/22/2017: TOTAL KNEE REVISION; Right     Comment:  Procedure: TOTAL KNEE REVISION;  Surgeon: Dereck Leep, MD;  Location: [facility] ORS;  Service: Orthopedics;                Laterality: Right;  BMI    Body Mass Index: 39.73 kg/m      Reproductive/Obstetrics negative  OB ROS                             Anesthesia Physical Anesthesia Plan  ASA: 3  Anesthesia Plan: Spinal   Post-op Pain Management:    Induction:   PONV Risk Score and Plan:   Airway Management Planned: Natural Airway and Nasal Cannula  Additional Equipment:   Intra-op Plan:   Post-operative Plan:   Informed Consent: I have reviewed the patients History and Physical, chart, labs and discussed the procedure including the risks, benefits and alternatives for the proposed anesthesia with the patient or authorized representative who has indicated his/her understanding and acceptance.     Dental Advisory Given  Plan Discussed with: Anesthesiologist, CRNA and Surgeon  Anesthesia Plan Comments: (Patient reports no bleeding problems and no anticoagulant use.  Plan for spinal with backup GA  Patient consented for risks of anesthesia including but not limited to:  - adverse reactions to medications - damage to eyes, teeth, lips or other oral mucosa - nerve damage due to positioning  - risk of bleeding, infection and or nerve damage from spinal that could lead to paralysis - risk of headache or failed spinal - damage to  teeth, lips or other oral mucosa - sore throat or hoarseness - damage to heart, brain, nerves, lungs, other parts of body or loss of life  Patient voiced understanding.)       Anesthesia Quick Evaluation

## 2022-12-01 NOTE — Anesthesia Procedure Notes (Signed)
Spinal  Patient location during procedure: OR Start time: 12/01/2022 11:30 AM End time: 12/01/2022 11:38 AM Reason for block: surgical anesthesia Staffing Performed: resident/CRNA  Resident/CRNA: Natasha Mead, CRNA Performed by: Natasha Mead, CRNA Authorized by: Andria Frames, MD   Preanesthetic Checklist Completed: patient identified, IV checked, site marked, risks and benefits discussed, surgical consent, monitors and equipment checked, pre-op evaluation and timeout performed Spinal Block Patient position: sitting Prep: DuraPrep Patient monitoring: heart rate, cardiac monitor, continuous pulse ox and blood pressure Approach: midline Location: L3-4 Injection technique: single-shot Needle Needle type: Pencan  Needle gauge: 24 G Needle length: 10 cm Assessment Sensory level: T4 Events: CSF return

## 2022-12-01 NOTE — Interval H&P Note (Signed)
History and Physical Interval Note:  12/01/2022 11:07 AM  Ellis Parents  has presented today for surgery, with the diagnosis of PRIMARY OSTEOARTHRITIS OF LEFT KNEE..  The various methods of treatment have been discussed with the patient and family. After consideration of risks, benefits and other options for treatment, the patient has consented to  Procedure(s): COMPUTER ASSISTED TOTAL KNEE ARTHROPLASTY - RNFA (Left) as a surgical intervention.  The patient's history has been reviewed, patient examined, no change in status, stable for surgery.  I have reviewed the patient's chart and labs.  Questions were answered to the patient's satisfaction.     [place]

## 2022-12-01 NOTE — Op Note (Signed)
OPERATIVE NOTE  DATE OF SURGERY:  12/01/2022  PATIENT NAME:  Mckenzie Roberts   DOB: [birth year]  MRN: [id number]  PRE-OPERATIVE DIAGNOSIS: Degenerative arthrosis of the left knee, primary  POST-OPERATIVE DIAGNOSIS:  Same  PROCEDURE:  Left total knee arthroplasty using computer-assisted navigation  SURGEON:  Marciano Sequin. M.D.  ANESTHESIA: spinal  ESTIMATED BLOOD LOSS: 50 mL  FLUIDS REPLACED: 1500 mL of crystalloid  TOURNIQUET TIME: 93 minutes  DRAINS: 2 medium Hemovac drains  SOFT TISSUE RELEASES: Anterior cruciate ligament, posterior cruciate ligament, deep medial collateral ligament, patellofemoral ligament  IMPLANTS UTILIZED: DePuy Attune size 5N posterior stabilized femoral component (cemented), size 4 rotating platform tibial component (cemented), 35 mm medialized dome patella (cemented), and a 5 mm stabilized rotating platform polyethylene insert.  INDICATIONS FOR SURGERY: Mckenzie Roberts is a 52 y.o. year old female with a long history of progressive knee pain. X-rays demonstrated severe degenerative changes in tricompartmental fashion. The patient had not seen any significant improvement despite conservative nonsurgical intervention. After discussion of the risks and benefits of surgical intervention, the patient expressed understanding of the risks benefits and agree with plans for total knee arthroplasty.   The risks, benefits, and alternatives were discussed at length including but not limited to the risks of infection, bleeding, nerve injury, stiffness, blood clots, the need for revision surgery, cardiopulmonary complications, among others, and they were willing to proceed.  PROCEDURE IN DETAIL: The patient was brought into the operating room and, after adequate spinal anesthesia was achieved, a tourniquet was placed on the patient's upper thigh. The patient's knee and leg were cleaned and prepped with alcohol and DuraPrep and draped in the usual sterile fashion. A "timeout"  was performed as per usual protocol. The lower extremity was exsanguinated using an Esmarch, and the tourniquet was inflated to 300 mmHg. An anterior longitudinal incision was made followed by a standard mid vastus approach. The deep fibers of the medial collateral ligament were elevated in a subperiosteal fashion off of the medial flare of the tibia so as to maintain a continuous soft tissue sleeve. The patella was subluxed laterally and the patellofemoral ligament was incised. Inspection of the knee demonstrated severe degenerative changes with full-thickness loss of articular cartilage. Osteophytes were debrided using a rongeur. Anterior and posterior cruciate ligaments were excised. Two 4.0 mm Schanz pins were inserted in the femur and into the tibia for attachment of the array of trackers used for computer-assisted navigation. Hip center was identified using a circumduction technique. Distal landmarks were mapped using the computer. The distal femur and proximal tibia were mapped using the computer. The distal femoral cutting guide was positioned using computer-assisted navigation so as to achieve a 5 distal valgus cut. The femur was sized and it was felt that a size 5N femoral component was appropriate. A size 5 femoral cutting guide was positioned and the anterior cut was performed and verified using the computer. This was followed by completion of the posterior and chamfer cuts. Femoral cutting guide for the central box was then positioned in the center box cut was performed.  Attention was then directed to the proximal tibia. Medial and lateral menisci were excised. The extramedullary tibial cutting guide was positioned using computer-assisted navigation so as to achieve a 0 varus-valgus alignment and 3 posterior slope. The cut was performed and verified using the computer. The proximal tibia was sized and it was felt that a size 4 tibial tray was appropriate. Tibial and femoral trials were inserted  followed by insertion  of a 5 mm polyethylene insert. This allowed for excellent mediolateral soft tissue balancing both in flexion and in full extension. Finally, the patella was cut and prepared so as to accommodate a 35 mm medialized dome patella. A patella trial was placed and the knee was placed through a range of motion with excellent patellar tracking appreciated. The femoral trial was removed after debridement of posterior osteophytes. The central post-hole for the tibial component was reamed followed by insertion of a keel punch. Tibial trials were then removed. Cut surfaces of bone were irrigated with copious amounts of normal saline using pulsatile lavage and then suctioned dry. Polymethylmethacrylate cement with gentamicin was prepared in the usual fashion using a vacuum mixer. Cement was applied to the cut surface of the proximal tibia as well as along the undersurface of a size 4 rotating platform tibial component. Tibial component was positioned and impacted into place. Excess cement was removed using [employer] fast streamer. Cement was then applied to the cut surfaces of the femur as well as along the posterior flanges of the size 5N femoral component. The femoral component was positioned and impacted into place. Excess cement was removed using [employer] fast streamer. A 5 mm polyethylene trial was inserted and the knee was brought into full extension with steady axial compression applied. Finally, cement was applied to the backside of a 35 mm medialized dome patella and the patellar component was positioned and patellar clamp applied. Excess cement was removed using [employer] fast streamer. After adequate curing of the cement, the tourniquet was deflated after a total tourniquet time of 93 minutes. Hemostasis was achieved using electrocautery. The knee was irrigated with copious amounts of normal saline using pulsatile lavage followed by 450 ml of Surgiphor and then suctioned dry. 20 mL of 1.3% Exparel and 60 mL of 0.25%  Marcaine in 40 mL of normal saline was injected along the posterior capsule, medial and lateral gutters, and along the arthrotomy site. A 5 mm stabilized rotating platform polyethylene insert was inserted and the knee was placed through a range of motion with excellent mediolateral soft tissue balancing appreciated and excellent patellar tracking noted. 2 medium drains were placed in the wound bed and brought out through separate stab incisions. The medial parapatellar portion of the incision was reapproximated using interrupted sutures of #1 Vicryl. Subcutaneous tissue was approximated in layers using first #0 Vicryl followed #2-0 Vicryl. The skin was approximated with skin staples. A sterile dressing was applied.  The patient tolerated the procedure well and was transported to the recovery room in stable condition.    Alfreddie Consalvo P. Holley Bouche., M.D.

## 2022-12-01 NOTE — Transfer of Care (Signed)
Immediate Anesthesia Transfer of Care Note  Patient: Maicie Boyett  Procedure(s) Performed: COMPUTER ASSISTED TOTAL KNEE ARTHROPLASTY - RNFA (Left: Knee)  Patient Location: PACU  Anesthesia Type:General  Level of Consciousness: awake, drowsy, and patient cooperative  Airway & Oxygen Therapy: Patient Spontanous Breathing  Post-op Assessment: Report given to RN and Post -op Vital signs reviewed and stable  Post vital signs: Reviewed and stable  Last Vitals:  Vitals Value Taken Time  BP 117/70 12/01/22 1515  Temp    Pulse 87 12/01/22 1517  Resp 22 12/01/22 1517  SpO2 94 % 12/01/22 1517  Vitals shown include unvalidated device data.  Last Pain:  Vitals:   12/01/22 0950  TempSrc: Oral  PainSc: 4          Complications: No notable events documented.

## 2022-12-01 NOTE — H&P (Signed)
ORTHOPAEDIC HISTORY & PHYSICAL Mckenzie Roberts, [place] - 11/26/2022 8:45 AM EST Formatting of this note is different from the original. Images from the original note were not included. Chief Complaint: Chief Complaint Patient presents with Left Knee - Pre-op Exam  Reason for Visit: The patient is a 52 y.o. female who presents today for a history and physical for an upcoming left total knee arthroplasty to be done by Dr. Marry Guan on December 01, 2022. The patient has seen Dr. Marry Guan in the past for a reevaluation of both knees. She is over 5 years status post revision of a right medial unicompartmental knee arthroplasty to a total knee arthroplasty. She denies any significant right knee pain, swelling, locking, or giving way. She continues to be pleased with the right knee.  She reports a several year history of progressive left knee pain. She localizes most of the pain along the medial aspect of the knee. She reports some swelling, no locking, and no giving way of the knee. The pain is aggravated by any weight bearing. The knee pain limits the patient's ability to ambulate long distances. The patient has not appreciated any significant improvement despite NSAIDs, corticosteroid injections, viscosupplementation, cold therapy, physical therapy, and activity modification. She is not using any ambulatory aids. The patient states that the left knee pain has progressed to the point that it is significantly interfering with her activities of daily living.  Medications: Current Outpatient Medications Medication Sig Dispense Refill amoxicillin (AMOXIL) 500 MG tablet TAKE 4 TABLETS BY MOUTH 1 HOUR BEFORE APPOINTMENT aspirin 81 MG EC tablet Take 81 mg by mouth once daily. buPROPion (WELLBUTRIN XL) 150 MG XL tablet TK 1 T PO QAM loratadine (CLARITIN) 10 mg tablet Take 10 mg by mouth once daily.  meloxicam (MOBIC) 7.5 MG tablet TAKE ONE TABLET BY MOUTH TWICE DAILY 60 tablet 0 metFORMIN (GLUCOPHAGE) 500 MG  tablet Take 500 mg by mouth nightly ezetimibe (ZETIA) 10 mg tablet Take 1 tablet every day by oral route. (Patient not taking: Reported on 11/26/2022)  No current facility-administered medications for this visit.  Allergies: Allergies Allergen Reactions Niacin Syncope Flushing- Syncope reaction after taking dose increased to 1000 mg 3 x day. Sulfa (Sulfonamide Antibiotics) Other (See Comments) As a child, does not remember  Past Medical History: Past Medical History: Diagnosis Date Depression Hyperlipidemia  Past Surgical History: Past Surgical History: Procedure Laterality Date Right knee arthroscopy, medial and lateral meniscectomies, and microfracture 05/2015 Dr Vonna Drafts - [facility] Right knee medial unicompartmental arthroplasty 02/07/2016 Dr Melvyn Novas [facility] Revision of a right medial unicompartmental knee arthroplasty to a right total knee arthroplasty (removal of unicompartemental knee implants) 06/22/2017 Dr Marry Guan  Social History: Social History  Socioeconomic History Marital status: Married Spouse name: Jeneen Rinks Number of children: 1 Years of education: 61 Occupational History Occupation: Investment banker, [employer] Tobacco Use Smoking status: Never Smokeless tobacco: Never Vaping Use Vaping Use: Never used Substance and Sexual Activity Alcohol use: Yes Alcohol/week: 1.7 standard drinks of alcohol Types: 2 Standard drinks or equivalent per week Drug use: No Sexual activity: Yes Partners: Male Social History Narrative 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 She does not smoke or use any recreational drugs. She drinks 2-3 alcoholic beverages a week. She is married and has one daughter. She lives with her husband in [place], [place]  Family History: Family History Problem Relation Age of Onset High blood pressure (Hypertension) Mother Diabetes Mother High blood pressure (Hypertension)  Father Diabetes Father Bipolar disorder Sister Bipolar disorder Daughter  Review of Systems: A comprehensive 14 point ROS was performed, reviewed, and the pertinent orthopaedic findings are documented in the HPI.  Exam BP (!) 160/100  Ht 162.6 cm ('5\' 4"'$ )  Wt (!) 105.9 kg (233 lb 6.4 oz)  LMP 02/04/2019  BMI 40.06 kg/m  General: Well-developed, well-nourished female seen in no acute distress. Antalgic gait. Varus thrust to the left knee.  HEENT: Atraumatic, normocephalic. Pupils are equal and reactive to light. Extraocular motion is intact. Sclera are clear. Oropharynx is clear with moist mucosa.  Neck: Supple, nontender, and with good ROM. No thyromegaly, adenopathy, JVD, or carotid bruits.  Lungs: Clear to auscultation bilaterally.  Cardiovascular: Regular rate and rhythm. Normal S1, S2. No murmur . No appreciable gallops or rubs. Peripheral pulses are palpable. No lower extremity edema. Homan`s test is negative.  Abdomen: Soft, nontender, nondistended. Bowel sounds are present.  Extremities: Good strength, stability, and range of motion of the upper extremities. Good range of motion of the hips and ankles.  Right Knee: Soft tissue swelling: none Effusion: none Erythema: none Crepitance: none Tenderness: No focal tenderness Alignment: normal Mediolateral laxity: stable Posterior sag: negative Patellar tracking: Good tracking without evidence of subluxation or tilt Atrophy: No significant atrophy. Quadriceps tone was good. Range of motion: 0/0/122 degrees  Left Knee: Soft tissue swelling: none Effusion: minimal Erythema: none Crepitance: mild Tenderness: medial joint line Alignment: relative varus Mediolateral laxity: medial pseudolaxity Posterior sag: negative Patellar tracking: Good tracking without evidence of subluxation or tilt Anterior drawer test:negative Lachman`s test: negative McMurray`s test: negative Atrophy: No significant  atrophy. Quadriceps tone was good. Range of Motion: 0/0/124 degrees  Neurologic: Awake, alert, and oriented. Sensory function is intact to pinprick and light touch. Motor strength is judged to be 5/5. Motor coordination is within normal limits. No apparent clonus. No tremor.  X-rays: I ordered and interpreted standing AP, lateral, and sunrise radiographs of the left knee that were obtained in the office today. There is significant narrowing of the medial cartilage space with bone-on-bone articulation and associated varus alignment. Osteophyte formation is noted. Subchondral sclerosis is noted. No evidence of fracture or dislocation.  I ordered and interpreted standing AP, lateral, and sunrise views of the right knee that were obtained in the office today. Good position of the total knee implants. Good alignment is noted on the AP view. Good cement mantle is appreciated without evidence of loosening. No evidence of polyethylene wear or osteolysis. No evidence of fracture or dislocation.  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 She continues to do well with the right knee.  Conservative treatment options for the left knee were reviewed with the patient. We discussed the risks and benefits of surgical intervention. The usual perioperative course was also discussed in detail. The patient expressed understanding of the risks and benefits of surgical intervention and would like to proceed with plans for left total knee arthroplasty.  Hemoglobin A1c is an indication of glucose control. Uncontrolled diabetes has been associated with perioperative complications including poor wound healing and surgical site infections. To decrease the risk of perioperative complications, hemoglobin A1c must be less than 8.0 prior to surgery. The patient is encouraged to work with her  primary care physician and/or endocrinologist to optimize glucose management.  The expected discharge plan for the patient is to go home with home  health physical therapy after surgery. She will most likely spend 1 night at the hospital.  MEDICAL CLEARANCE: Per anesthesiology. ACTIVITY: As tolerated. WORK STATUS: Anticipate out of work for 6-8 weeks following surgery. THERAPY: Preoperative physical therapy evaluation. MEDICATIONS: Requested Prescriptions  No prescriptions requested or ordered in this encounter  FOLLOW-UP: Return for Postoperative care.  This note will serve as the history and physical for surgery scheduled on December 01, 2022 for a left total knee arthroplasty to be done by Dr. Marry Guan.  JONATHAN T MUNDY PA-C, M.S.  This note was generated in part with voice recognition software and I apologize for any typographical errors that were not detected and corrected.  Electronically signed by Mckenzie Presto, PA at 11/26/2022 9:32 AM EST

## 2022-12-02 ENCOUNTER — Encounter: Payer: Self-pay | Admitting: Orthopedic Surgery

## 2022-12-02 DIAGNOSIS — M1712 Unilateral primary osteoarthritis, left knee: Secondary | ICD-10-CM | POA: Diagnosis not present

## 2022-12-02 LAB — GLUCOSE, CAPILLARY: Glucose-Capillary: 139 mg/dL — ABNORMAL HIGH (ref 70–99)

## 2022-12-02 MED ORDER — CELECOXIB 200 MG PO CAPS: 200.0000 mg | ORAL_CAPSULE | Freq: Two times a day (BID) | ORAL | 1 refills | Status: AC

## 2022-12-02 MED ORDER — ENOXAPARIN SODIUM 40 MG/0.4ML IJ SOSY: 40.0000 mg | PREFILLED_SYRINGE | INTRAMUSCULAR | 0 refills | Status: AC

## 2022-12-02 MED ORDER — OXYCODONE HCL 5 MG PO TABS
ORAL_TABLET | ORAL | Status: AC
  Filled 2022-12-02: qty 1

## 2022-12-02 MED ORDER — LORATADINE 10 MG PO TABS
ORAL_TABLET | ORAL | Status: AC
  Filled 2022-12-02: qty 1

## 2022-12-02 MED ORDER — ACETAMINOPHEN 10 MG/ML IV SOLN
INTRAVENOUS | Status: AC
  Administered 2022-12-02: 1000 mg via INTRAVENOUS
  Filled 2022-12-02: qty 100

## 2022-12-02 MED ORDER — OXYCODONE HCL 5 MG PO TABS: 5.0000 mg | ORAL_TABLET | ORAL | 0 refills | Status: AC | PRN

## 2022-12-02 MED ORDER — TRAMADOL HCL 50 MG PO TABS: 50.0000 mg | ORAL_TABLET | ORAL | 0 refills | Status: AC | PRN

## 2022-12-02 MED ORDER — ENOXAPARIN SODIUM 30 MG/0.3ML IJ SOSY
PREFILLED_SYRINGE | INTRAMUSCULAR | Status: AC
  Administered 2022-12-02: 30 mg via SUBCUTANEOUS
  Filled 2022-12-02: qty 0.3

## 2022-12-02 MED ORDER — INSULIN ASPART 100 UNIT/ML IJ SOLN
INTRAMUSCULAR | Status: AC
  Administered 2022-12-02: 2 [IU] via SUBCUTANEOUS
  Filled 2022-12-02: qty 1

## 2022-12-02 MED ORDER — SENNOSIDES-DOCUSATE SODIUM 8.6-50 MG PO TABS
ORAL_TABLET | ORAL | Status: AC
  Administered 2022-12-02: 1 via ORAL
  Filled 2022-12-02: qty 1

## 2022-12-02 MED ORDER — METOCLOPRAMIDE HCL 10 MG PO TABS
ORAL_TABLET | ORAL | Status: AC
  Filled 2022-12-02: qty 1

## 2022-12-02 MED ORDER — MAGNESIUM HYDROXIDE 400 MG/5ML PO SUSP
ORAL | Status: AC
  Administered 2022-12-02: 30 mL via ORAL
  Filled 2022-12-02: qty 30

## 2022-12-02 MED ORDER — EZETIMIBE 10 MG PO TABS
ORAL_TABLET | ORAL | Status: AC
  Administered 2022-12-02: 10 mg via ORAL
  Filled 2022-12-02: qty 1

## 2022-12-02 MED ORDER — CELECOXIB 200 MG PO CAPS
ORAL_CAPSULE | ORAL | Status: AC
  Administered 2022-12-02: 200 mg via ORAL
  Filled 2022-12-02: qty 1

## 2022-12-02 NOTE — TOC Progression Note (Signed)
Transition of Care [facility]) - Progression Note    Patient Details  Name: Llasmin Macik MRN: [id number] Date of Birth: October 28, 1970  Transition of Care [facility] CM/SW Contact  Conception Oms, RN Phone Number: 12/02/2022, 10:45 AM  Clinical Narrative:    [place] set up for [facility] prior to surgery by surgeons office Has DME at home and does not need   Expected Discharge Plan: [place] Barriers to Discharge: No Barriers Identified  Expected Discharge Plan and Services   Discharge Planning Services: CM Consult   Living arrangements for the past 2 months: Single Family Home Expected Discharge Date: 12/02/22               DME Arranged: Gilford Rile rolling, 3-N-1 DME Agency: [employer] Date DME Agency Contacted: 12/02/22 Time DME Agency Contacted: [id number] Representative spoke with at DME Agency: Intake HH Arranged: PT, OT [place] Agency: [place] Date [place]: 12/02/22 Time [place]: [id number] Representative spoke with at [place]: [place]   Social Determinants of Health ([place]) Interventions SDOH Screenings   Food Insecurity: No Food Insecurity (12/01/2022)  Housing: Low Risk  (12/01/2022)  Transportation Needs: No Transportation Needs (12/01/2022)  Utilities: Not At Risk (12/01/2022)  Tobacco Use: Low Risk  (12/01/2022)    Readmission Risk Interventions     No data to display

## 2022-12-02 NOTE — Anesthesia Postprocedure Evaluation (Signed)
Anesthesia Post Note  Patient: Mckenzie Roberts  Procedure(s) Performed: COMPUTER ASSISTED TOTAL KNEE ARTHROPLASTY - RNFA (Left: Knee)  Patient location during evaluation: Nursing Unit Anesthesia Type: Spinal Level of consciousness: awake Pain management: pain level controlled Respiratory status: spontaneous breathing Cardiovascular status: stable Postop Assessment: no headache Anesthetic complications: no   No notable events documented.   Last Vitals:  Vitals:   12/02/22 0338 12/02/22 0705  BP: 131/82 121/77  Pulse: 90 87  Resp: 17 14  Temp: 36.9 C 36.9 C  SpO2: 97% 99%    Last Pain:  Vitals:   12/02/22 0705  TempSrc: Temporal  PainSc:                  Lerry Liner

## 2022-12-02 NOTE — Discharge Summary (Signed)
Physician Discharge Summary  Subjective: 1 Day Post-Op Procedure(s) (LRB): COMPUTER ASSISTED TOTAL KNEE ARTHROPLASTY - RNFA (Left) Patient reports pain as mild.   Patient seen in rounds with Dr. Marry Guan. Patient is well, and has had no acute complaints or problems Patient is ready to go home with home health physical therapy  Physician Discharge Summary  Patient ID: Mckenzie Roberts MRN: [id number] DOB/AGE: 1971-02-25 52 y.o.  Admit date: 12/01/2022 Discharge date: 12/02/2022  Admission Diagnoses:  Discharge Diagnoses:  Principal Problem:   Total knee replacement status   Discharged Condition: good  Hospital Course: Patient is postop day 1 from a left total knee arthroplasty.  Her vitals have remained stable.  Her pain is manageable.  She is ready to do physical therapy this morning.  She will be discharged home after completing physical therapy goals.  Treatments: surgery:  Left total knee arthroplasty using computer-assisted navigation   SURGEON:  Marciano Sequin. M.D.   ANESTHESIA: spinal   ESTIMATED BLOOD LOSS: 50 mL   FLUIDS REPLACED: 1500 mL of crystalloid   TOURNIQUET TIME: 93 minutes   DRAINS: 2 medium Hemovac drains   SOFT TISSUE RELEASES: Anterior cruciate ligament, posterior cruciate ligament, deep medial collateral ligament, patellofemoral ligament   IMPLANTS UTILIZED: DePuy Attune size 5N posterior stabilized femoral component (cemented), size 4 rotating platform tibial component (cemented), 35 mm medialized dome patella (cemented), and a 5 mm stabilized rotating platform polyethylene insert.  Discharge Exam: Blood pressure 121/77, pulse 87, temperature 98.4 F (36.9 C), temperature source Temporal, resp. rate 14, height '5\' 4"'$  (1.626 m), weight 105 kg, last menstrual period 08/15/2018, SpO2 99 %.   Disposition: Discharge disposition: 01-Home or Self Care        Allergies as of 12/02/2022       Reactions   Niacin Other (See Comments)   Flushing-  Syncope reaction after taking dose increased to 1000 mg 3 x day.   Sulfa Antibiotics Rash        Medication List     STOP taking these medications    aspirin EC 81 MG tablet   meloxicam 7.5 MG tablet Commonly known as: MOBIC   naproxen sodium 220 MG tablet Commonly known as: ALEVE       TAKE these medications    Aleve PM 220-25 MG Tabs Generic drug: Naproxen Sod-diphenhydrAMINE Take 1 tablet by mouth at bedtime as needed (sleep).   buPROPion 150 MG 24 hr tablet Commonly known as: WELLBUTRIN XL Take 150 mg by mouth every morning.   calcium carbonate 500 MG chewable tablet Commonly known as: TUMS - dosed in mg elemental calcium Chew 3-4 tablets by mouth daily as needed for indigestion or heartburn.   celecoxib 200 MG capsule Commonly known as: CELEBREX Take 1 capsule (200 mg total) by mouth 2 (two) times daily.   enoxaparin 40 MG/0.4ML injection Commonly known as: LOVENOX Inject 0.4 mLs (40 mg total) into the skin daily for 14 days.   ezetimibe 10 MG tablet Commonly known as: ZETIA Take 10 mg by mouth daily.   loratadine 10 MG tablet Commonly known as: CLARITIN Take 10 mg by mouth every morning.   metFORMIN 500 MG tablet Commonly known as: GLUCOPHAGE Take 500 mg by mouth 2 (two) times daily with a meal.   metoCLOPramide 10 MG tablet Commonly known as: Reglan Take 1 tablet (10 mg total) by mouth at bedtime and may repeat dose one time if needed. May repeat a dose in the morning if nauseated  oxyCODONE 5 MG immediate release tablet Commonly known as: Oxy IR/ROXICODONE Take 1 tablet (5 mg total) by mouth every 4 (four) hours as needed for moderate pain (pain score 4-6).   rosuvastatin 5 MG tablet Commonly known as: CRESTOR Take 5 mg by mouth every morning.   traMADol 50 MG tablet Commonly known as: ULTRAM Take 1-2 tablets (50-100 mg total) by mouth every 4 (four) hours as needed for moderate pain.   Vitamin D 50 MCG (2000 UT) tablet Take 4,000  Units by mouth daily.   VITAMIN D3 GUMMIES PO Take by mouth QID.               Durable Medical Equipment  (From admission, onward)           Start     Ordered   12/01/22 1724  DME Walker rolling  Once       Question:  Patient needs a walker to treat with the following condition  Answer:  Total knee replacement status   12/01/22 1723   12/01/22 1724  DME Bedside commode  Once       Comments: Patient is not able to walk the distance required to go the bathroom, or he/she is unable to safely negotiate stairs required to access the bathroom.  A 3in1 BSC will alleviate this problem  Question:  Patient needs a bedside commode to treat with the following condition  Answer:  Total knee replacement status   12/01/22 1723            Follow-up Information     Reche Dixon, PA-C Follow up on 12/15/2022.   Specialty: Orthopedic Surgery Why: at 10:15am Contact information: [place] [address] [phone]         Dereck Leep, MD Follow up on 01/13/2023.   Specialty: Orthopedic Surgery Why: at 3:00pm Contact information: [address] [facility] [place] [address] [phone]                 Signed: Prescott Parma, Jayonna Meyering 12/02/2022, 7:19 AM   Objective: Vital signs in last 24 hours: Temp:  [97.3 F (36.3 C)-98.4 F (36.9 C)] 98.4 F (36.9 C) (02/27 0705) Pulse Rate:  [79-93] 87 (02/27 0705) Resp:  [14-25] 14 (02/27 0705) BP: (111-164)/(66-95) 121/77 (02/27 0705) SpO2:  [93 %-99 %] 99 % (02/27 0705) Weight:  [105 kg] 105 kg (02/26 0950)  Intake/Output from previous day:  Intake/Output Summary (Last 24 hours) at 12/02/2022 0719 Last data filed at 12/02/2022 0552 Gross per 24 hour  Intake 2740 ml  Output 890 ml  Net 1850 ml    Intake/Output this shift: No intake/output data recorded.  Labs: No results for input(s): "HGB" in the last 72 hours. No results for input(s): "WBC", "RBC", "HCT",  "PLT" in the last 72 hours. No results for input(s): "NA", "K", "CL", "CO2", "BUN", "CREATININE", "GLUCOSE", "CALCIUM" in the last 72 hours. No results for input(s): "LABPT", "INR" in the last 72 hours.  EXAM: General - Patient is Alert and Oriented Extremity - Neurovascular intact Sensation intact distally Dorsiflexion/Plantar flexion intact Compartment soft Incision - clean, dry, no drainage, with a Hemovac tubing intact on removal. Motor Function -plantarflexion and dorsiflexion intact.  Able to straight leg raise independently.  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   Weight as of this encounter: 105 kg. Advance diet Up with therapy D/C IV fluids Discharge home with home health Diet - Regular diet Follow up - in 2 weeks Activity - WBAT Disposition - Home Condition Upon Discharge - Stable DVT Prophylaxis - Lovenox and TED hose  Reche Dixon, PA-C Orthopaedic Surgery 12/02/2022, 7:19 AM

## 2022-12-02 NOTE — Evaluation (Signed)
Occupational Therapy Evaluation Patient Details Name: Mckenzie Roberts MRN: [id number] DOB: 11-Apr-1971 Today's Date: 12/02/2022   History of Present Illness Mckenzie Roberts is a 52 year old female, s/p left total knee arthroplasty   Clinical Impression   Pt seen for OT evaluation this date, POD#1 from above surgery. Pt was independent in all ADLs prior to surgery, is eager to return to PLOF with less pain and improved safety and independence. Pt managed very well today, denies pain, able to perform transfers, toileting, grooming in standing, LB and UB dressing, all with Mod I-SUPV. Pt instructed in polar care mgt, falls prevention strategies, home/routines modifications, DME/AE for LB bathing and dressing tasks, and compression stocking mgt. Pt verbalizes understanding. This is her second TKA, so she is aware of rehab needs and has all necessary DME/AE at home. Husband will be available to assist with any needs. No further OT services required at this time.     Recommendations for follow up therapy are one component of a multi-disciplinary discharge planning process, led by the attending physician.  Recommendations may be updated based on patient status, additional functional criteria and insurance authorization.   Follow Up Recommendations  No OT follow up     Assistance Recommended at Discharge PRN  Patient can return home with the following Assist for transportation;Assistance with cooking/housework;A little help with bathing/dressing/bathroom    Functional Status Assessment  Patient has had a recent decline in their functional status and demonstrates the ability to make significant improvements in function in a reasonable and predictable amount of time.  Equipment Recommendations  None recommended by OT    Recommendations for Other Services       Precautions / Restrictions Precautions Precautions: Fall Restrictions Other Position/Activity Restrictions: WBAT      Mobility Bed Mobility                General bed mobility comments: NT -- pt received, left in recliner    Transfers Overall transfer level: Needs assistance Equipment used: Rolling walker (2 wheels) Transfers: Sit to/from Stand Sit to Stand: Supervision                  Balance Overall balance assessment: Needs assistance Sitting-balance support: No upper extremity supported Sitting balance-Leahy Scale: Normal     Standing balance support: Bilateral upper extremity supported, During functional activity Standing balance-Leahy Scale: Good                             ADL either performed or assessed with clinical judgement   ADL Overall ADL's : Needs assistance/impaired     Grooming: Wash/dry hands;Wash/dry face;Standing;Supervision/safety           Upper Body Dressing : Modified independent;Sitting   Lower Body Dressing: Supervision/safety;Sit to/from stand   Toilet Transfer: Supervision/safety;Comfort height toilet;Rolling walker (2 wheels)   Toileting- Clothing Manipulation and Hygiene: Modified independent               Vision         Perception     Praxis      Pertinent Vitals/Pain Pain Assessment Pain Assessment: No/denies pain     Hand Dominance Right   Extremity/Trunk Assessment Upper Extremity Assessment Upper Extremity Assessment: Overall WFL for tasks assessed   Lower Extremity Assessment Lower Extremity Assessment: LLE deficits/detail LLE Deficits / Details: s/p L TKA   Cervical / Trunk Assessment Cervical / Trunk Assessment: Normal   Communication Communication Communication: No difficulties  Cognition Arousal/Alertness: Awake/alert Behavior During Therapy: WFL for tasks assessed/performed Overall Cognitive Status: Within Functional Limits for tasks assessed                                       General Comments       Exercises Other Exercises Other Exercises: Educ re: polar care, LB dressing/bathing,  pain mgmt, donning compression stockings, falls prevention, pet care mgmt   Shoulder Instructions      Home Living Family/patient expects to be discharged to:: Private residence Living Arrangements: Spouse/significant other Available Help at Discharge: Family;Available PRN/intermittently Type of Home: House Home Access: Stairs to enter [employer] of Steps: 2 Entrance Stairs-Rails: Right;Left Home Layout: Able to live on main level with bedroom/bathroom;Two level     Bathroom Shower/Tub: Occupational psychologist: Handicapped height     Home Equipment: Conservation officer, nature (2 wheels);Shower seat          Prior Functioning/Environment Prior Level of Function : Independent/Modified Independent             Mobility Comments: IND, no falls history, ambulates without AD ADLs Comments: IND        OT Problem List: Decreased strength;Decreased range of motion;Decreased activity tolerance;Impaired balance (sitting and/or standing)      OT Treatment/Interventions:      OT Goals(Current goals can be found in the care plan section) Acute Rehab OT Goals Patient Stated Goal: to go home ASAP OT Goal Formulation: With patient Time For Goal Achievement: 12/16/22 Potential to Achieve Goals: Good  OT Frequency:      Co-evaluation              AM-PAC OT "6 Clicks" Daily Activity     Outcome Measure Help from another person eating meals?: None Help from another person taking care of personal grooming?: None Help from another person toileting, which includes using toliet, bedpan, or urinal?: None Help from another person bathing (including washing, rinsing, drying)?: None Help from another person to put on and taking off regular upper body clothing?: None Help from another person to put on and taking off regular lower body clothing?: None 6 Click Score: 24   End of Session Equipment Utilized During Treatment: Rolling walker (2 wheels) Nurse Communication:  Mobility status  Activity Tolerance: Patient tolerated treatment well Patient left: in chair;with call bell/phone within reach  OT Visit Diagnosis: Muscle weakness (generalized) (M62.81)                Time: 1100-1117 OT Time Calculation (min): 17 min Charges:  OT General Charges $OT Visit: 1 Visit OT Evaluation $OT Eval Low Complexity: 1 Low OT Treatments $Self Care/Home Management : 8-22 mins Josiah Lobo, PhD, MS, OTR/L 12/02/22, 11:35 AM

## 2022-12-02 NOTE — Progress Notes (Signed)
Patient is not able to walk the distance required to go the bathroom, or he/she is unable to safely negotiate stairs required to access the bathroom.  A 3in1 BSC will alleviate this problem  

## 2022-12-02 NOTE — Evaluation (Signed)
Physical Therapy Evaluation Patient Details Name: Mckenzie Roberts MRN: [id number] DOB: 12-06-1970 Today's Date: 12/02/2022  History of Present Illness  Pt is a 52 year old female diagnosed with degenerative arthrosis of the left knee and is s/p elective left TKA.  PMH includes: depression, HLD, TIA, and CVA.   Clinical Impression  Pt was pleasant and motivated to participate during the session and put forth good effort throughout. Pt required cuing for sequencing and extra time and effort with functional tasks but required no physical assistance during the session.  Pt was able to amb 2 x 100 feet with step-to pattern initially that quickly progressed to step-through pattern.  Pt was only mildly antalgic on the LLE and presented with good control and stability during gait as well as during stair training with good carryover of proper sequencing.  Pt will benefit from HHPT upon discharge to safely address deficits listed in patient problem list for decreased caregiver assistance and eventual return to PLOF.          Recommendations for follow up therapy are one component of a multi-disciplinary discharge planning process, led by the attending physician.  Recommendations may be updated based on patient status, additional functional criteria and insurance authorization.  Follow Up Recommendations Home health PT      Assistance Recommended at Discharge Intermittent Supervision/Assistance  Patient can return home with the following  A little help with walking and/or transfers;A little help with bathing/dressing/bathroom;Assistance with cooking/housework;Help with stairs or ramp for entrance;Assist for transportation    Equipment Recommendations None recommended by PT  Recommendations for Other Services       Functional Status Assessment Patient has had a recent decline in their functional status and demonstrates the ability to make significant improvements in function in a reasonable and  predictable amount of time.     Precautions / Restrictions Precautions Precautions: Fall Restrictions Weight Bearing Restrictions: Yes LLE Weight Bearing: Weight bearing as tolerated Other Position/Activity Restrictions: Pt able to perform Ind LLE SLR without extensor lag, no KI required      Mobility  Bed Mobility Overal bed mobility: Modified Independent             General bed mobility comments: Min extra time and effort only    Transfers Overall transfer level: Needs assistance Equipment used: Rolling walker (2 wheels) Transfers: Sit to/from Stand Sit to Stand: Supervision           General transfer comment: Good control and stability with min verbal cues for sequencing    Ambulation/Gait Ambulation/Gait assistance: Min guard Gait Distance (Feet): 100 Feet x 2 Assistive device: Rolling walker (2 wheels) Gait Pattern/deviations: Step-to pattern, Step-through pattern, Decreased step length - right, Decreased step length - left, Decreased stance time - left Gait velocity: decreased     General Gait Details: Step-to pattern initially that progressed quickly to step-through pattern, good control and stability throughout  Stairs Stairs: Yes Stairs assistance: Min guard Stair Management: One rail Left, Forwards, Step to pattern Number of Stairs: 4 General stair comments: Min verbal and visual cues for sequencing with very good control and stability as well as carryover of proper sequencing  Wheelchair Mobility    Modified Rankin (Stroke Patients Only)       Balance Overall balance assessment: Needs assistance Sitting-balance support: No upper extremity supported Sitting balance-Leahy Scale: Normal     Standing balance support: Bilateral upper extremity supported, During functional activity Standing balance-Leahy Scale: Good  Pertinent Vitals/Pain Pain Assessment Pain Assessment: 0-10 Pain Score: 2  Pain  Location: L knee Pain Descriptors / Indicators: Sore Pain Intervention(s): Monitored during session, Premedicated before session, Repositioned, Ice applied    Home Living Family/patient expects to be discharged to:: Private residence Living Arrangements: Spouse/significant other Available Help at Discharge: Family;Available 24 hours/day Type of Home: House Home Access: Stairs to enter Entrance Stairs-Rails: Right;Left (can not reach both) Entrance Stairs-Number of Steps: 2 steps without rails or 4 steps with wide bilateral rails   Home Layout: Able to live on main level with bedroom/bathroom;Two level Home Equipment: Conservation officer, nature (2 wheels);Shower seat;Grab bars - toilet      Prior Function Prior Level of Function : Independent/Modified Independent             Mobility Comments: Ind amb community distances without an AD, no fall history, swims for 30 min 4-5x/wk ADLs Comments: Ind with ADLs     Hand Dominance   Dominant Hand: Right    Extremity/Trunk Assessment   Upper Extremity Assessment Upper Extremity Assessment: Overall WFL for tasks assessed    Lower Extremity Assessment Lower Extremity Assessment: Generalized weakness;LLE deficits/detail LLE Deficits / Details: L hip flex strength >/= 3/5 LLE: Unable to fully assess due to pain LLE Sensation: WNL    Cervical / Trunk Assessment Cervical / Trunk Assessment: Normal  Communication   Communication: No difficulties  Cognition Arousal/Alertness: Awake/alert Behavior During Therapy: WFL for tasks assessed/performed Overall Cognitive Status: Within Functional Limits for tasks assessed                                          General Comments      Exercises Total Joint Exercises Ankle Circles/Pumps: AROM, Strengthening, Both, 10 reps Quad Sets: AROM, Strengthening, Left, 5 reps, 10 reps Straight Leg Raises: Strengthening, Left, 5 reps Long Arc Quad: AROM, Strengthening, Left, 5 reps, 10  reps Knee Flexion: AROM, Strengthening, Left, 5 reps, 10 reps Goniometric ROM: L knee AROM: 6-84 deg Other Exercises Other Exercises: Car transfer sequencing education Other Exercises: HEP education per handout Other Exercises: LLE positioning education to promote L knee ext PROM and prevent heel pressure   Assessment/Plan    PT Assessment Patient needs continued PT services  PT Problem List Decreased strength;Decreased range of motion;Decreased activity tolerance;Decreased balance;Decreased mobility;Decreased knowledge of use of DME;Pain       PT Treatment Interventions DME instruction;Gait training;Stair training;Functional mobility training;Therapeutic activities;Therapeutic exercise;Balance training;Patient/family education    PT Goals (Current goals can be found in the Care Plan section)  Acute Rehab PT Goals Patient Stated Goal: To walk better PT Goal Formulation: With patient Time For Goal Achievement: 12/15/22 Potential to Achieve Goals: Good    Frequency BID     Co-evaluation               AM-PAC PT "6 Clicks" Mobility  Outcome Measure Help needed turning from your back to your side while in a flat bed without using bedrails?: None Help needed moving from lying on your back to sitting on the side of a flat bed without using bedrails?: A Little Help needed moving to and from a bed to a chair (including a wheelchair)?: A Little Help needed standing up from a chair using your arms (e.g., wheelchair or bedside chair)?: A Little Help needed to walk in hospital room?: A Little Help needed climbing 3-5 steps  with a railing? : A Little 6 Click Score: 19    End of Session Equipment Utilized During Treatment: Gait belt Activity Tolerance: Patient tolerated treatment well Patient left: in chair;with call bell/phone within reach;with SCD's reapplied;Other (comment) (polar care donned to L knee) Nurse Communication: Mobility status;Weight bearing status PT Visit  Diagnosis: Other abnormalities of gait and mobility (R26.89);Muscle weakness (generalized) (M62.81);Pain Pain - Right/Left: Left Pain - part of body: Knee    Time: [id number] PT Time Calculation (min) (ACUTE ONLY): 36 min   Charges:   PT Evaluation $PT Eval Moderate Complexity: 1 Mod PT Treatments $Gait Training: 8-22 mins $Therapeutic Exercise: 8-22 mins      D. Scott Milany Geck PT, DPT 12/02/22, 12:04 PM

## 2022-12-02 NOTE — Progress Notes (Signed)
  Subjective: 1 Day Post-Op Procedure(s) (LRB): COMPUTER ASSISTED TOTAL KNEE ARTHROPLASTY - RNFA (Left) Patient reports pain as mild.   Patient is well, and has had no acute complaints or problems Plan is to go Home after hospital stay. Negative for chest pain and shortness of breath Fever: no Gastrointestinal: Negative for nausea and vomiting  Objective: Vital signs in last 24 hours: Temp:  [97.3 F (36.3 C)-98.4 F (36.9 C)] 98.4 F (36.9 C) (02/27 0705) Pulse Rate:  [79-93] 87 (02/27 0705) Resp:  [14-25] 14 (02/27 0705) BP: (111-164)/(66-95) 121/77 (02/27 0705) SpO2:  [93 %-99 %] 99 % (02/27 0705) Weight:  [105 kg] 105 kg (02/26 0950)  Intake/Output from previous day:  Intake/Output Summary (Last 24 hours) at 12/02/2022 0717 Last data filed at 12/02/2022 0552 Gross per 24 hour  Intake 2740 ml  Output 890 ml  Net 1850 ml    Intake/Output this shift: No intake/output data recorded.  Labs: No results for input(s): "HGB" in the last 72 hours. No results for input(s): "WBC", "RBC", "HCT", "PLT" in the last 72 hours. No results for input(s): "NA", "K", "CL", "CO2", "BUN", "CREATININE", "GLUCOSE", "CALCIUM" in the last 72 hours. No results for input(s): "LABPT", "INR" in the last 72 hours.   EXAM General - Patient is Alert and Oriented Extremity - Neurovascular intact Sensation intact distally Dorsiflexion/Plantar flexion intact Compartment soft Dressing/Incision - clean, dry, no drainage, with the Hemovac removed with no complication.  The Hemovac tubing was intact on removal. Motor Function - intact, moving foot and toes well on exam.  Able to straight leg raise independently.  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   Weight as of this encounter: 105 kg. Advance diet Up with therapy D/C IV fluids Discharge home with home health  DVT Prophylaxis - Lovenox, Foot Pumps, and TED hose Weight-Bearing as tolerated to left leg  Reche Dixon, PA-C Orthopaedic Surgery 12/02/2022, 7:17 AM

## 2022-12-04 ENCOUNTER — Encounter: Payer: Self-pay | Admitting: Orthopedic Surgery
